# Patient Record
Sex: Male | Born: 1985 | Race: Black or African American | Hispanic: No | Smoking: Never smoker
Health system: Southern US, Community
[De-identification: ages and names within clinical notes are randomized; demographics above are authoritative.]

## PROBLEM LIST (undated history)

## (undated) DIAGNOSIS — J302 Other seasonal allergic rhinitis: Secondary | ICD-10-CM

## (undated) DIAGNOSIS — F319 Bipolar disorder, unspecified: Secondary | ICD-10-CM

## (undated) DIAGNOSIS — R002 Palpitations: Secondary | ICD-10-CM

## (undated) DIAGNOSIS — F1410 Cocaine abuse, uncomplicated: Secondary | ICD-10-CM

## (undated) DIAGNOSIS — T7840XA Allergy, unspecified, initial encounter: Secondary | ICD-10-CM

## (undated) DIAGNOSIS — F32A Depression, unspecified: Secondary | ICD-10-CM

## (undated) DIAGNOSIS — J45909 Unspecified asthma, uncomplicated: Secondary | ICD-10-CM

## (undated) DIAGNOSIS — F329 Major depressive disorder, single episode, unspecified: Secondary | ICD-10-CM

## (undated) DIAGNOSIS — I10 Essential (primary) hypertension: Secondary | ICD-10-CM

## (undated) DIAGNOSIS — D696 Thrombocytopenia, unspecified: Secondary | ICD-10-CM

## (undated) DIAGNOSIS — Z973 Presence of spectacles and contact lenses: Secondary | ICD-10-CM

## (undated) DIAGNOSIS — F419 Anxiety disorder, unspecified: Secondary | ICD-10-CM

## (undated) DIAGNOSIS — K219 Gastro-esophageal reflux disease without esophagitis: Secondary | ICD-10-CM

## (undated) DIAGNOSIS — F99 Mental disorder, not otherwise specified: Secondary | ICD-10-CM

## (undated) HISTORY — DX: Allergy, unspecified, initial encounter: T78.40XA

## (undated) HISTORY — DX: Gastro-esophageal reflux disease without esophagitis: K21.9

## (undated) HISTORY — DX: Depression, unspecified: F32.A

## (undated) HISTORY — DX: Essential (primary) hypertension: I10

## (undated) HISTORY — DX: Other seasonal allergic rhinitis: J30.2

## (undated) HISTORY — DX: Thrombocytopenia, unspecified: D69.6

## (undated) HISTORY — PX: KNEE SURGERY: SHX244

## (undated) HISTORY — PX: MASTOIDECTOMY: SHX711

## (undated) HISTORY — DX: Presence of spectacles and contact lenses: Z97.3

## (undated) HISTORY — DX: Palpitations: R00.2

## (undated) HISTORY — DX: Bipolar disorder, unspecified: F31.9

## (undated) HISTORY — DX: Anxiety disorder, unspecified: F41.9

## (undated) HISTORY — DX: Unspecified asthma, uncomplicated: J45.909

## (undated) HISTORY — DX: Major depressive disorder, single episode, unspecified: F32.9

---
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
## 2009-11-22 ENCOUNTER — Emergency Department (HOSPITAL_COMMUNITY): Admission: EM | Admit: 2009-11-22 | Discharge: 2009-11-23 | Payer: Self-pay | Admitting: Emergency Medicine

## 2010-09-23 LAB — BASIC METABOLIC PANEL
BUN: 17 mg/dL (ref 6–23)
Calcium: 9.9 mg/dL (ref 8.4–10.5)
Chloride: 102 mEq/L (ref 96–112)
Creatinine, Ser: 1.44 mg/dL (ref 0.4–1.5)
GFR calc Af Amer: >60 mL/min (ref >60)
GFR calc non Af Amer: >60 mL/min (ref >60)

## 2010-09-23 LAB — ETHANOL: Alcohol, Ethyl (B): <5 mg/dL (ref 0–10)

## 2010-09-23 LAB — CBC
MCV: 85.4 fL (ref 78.0–100.0)
Platelets: 143 10*3/uL — ABNORMAL LOW (ref 150–400)
RBC: 5.77 MIL/uL (ref 4.22–5.81)
WBC: 11.2 10*3/uL — ABNORMAL HIGH (ref 4.0–10.5)

## 2010-09-23 LAB — DIFFERENTIAL
Basophils Relative: 0 % (ref 0–1)
Eosinophils Absolute: 0 10*3/uL (ref 0.0–0.7)
Lymphs Abs: 1.6 10*3/uL (ref 0.7–4.0)
Monocytes Relative: 7 % (ref 3–12)
Neutro Abs: 8.8 10*3/uL — ABNORMAL HIGH (ref 1.7–7.7)
Neutrophils Relative %: 78 % — ABNORMAL HIGH (ref 43–77)

## 2010-09-23 LAB — RAPID URINE DRUG SCREEN, HOSP PERFORMED
Amphetamines: NOT DETECTED
Barbiturates: NOT DETECTED
Cocaine: POSITIVE — AB
Opiates: NOT DETECTED

## 2011-07-06 ENCOUNTER — Emergency Department (HOSPITAL_COMMUNITY): Payer: Self-pay

## 2011-07-06 ENCOUNTER — Other Ambulatory Visit: Payer: Self-pay

## 2011-07-06 ENCOUNTER — Emergency Department (HOSPITAL_COMMUNITY)
Admission: EM | Admit: 2011-07-06 | Discharge: 2011-07-06 | Disposition: A | Discharge status: Home / Self Care | Payer: Self-pay | Attending: Emergency Medicine | Admitting: Emergency Medicine

## 2011-07-06 DIAGNOSIS — F419 Anxiety disorder, unspecified: Secondary | ICD-10-CM

## 2011-07-06 DIAGNOSIS — R209 Unspecified disturbances of skin sensation: Secondary | ICD-10-CM | POA: Insufficient documentation

## 2011-07-06 DIAGNOSIS — R0789 Other chest pain: Secondary | ICD-10-CM | POA: Insufficient documentation

## 2011-07-06 DIAGNOSIS — F411 Generalized anxiety disorder: Secondary | ICD-10-CM | POA: Insufficient documentation

## 2011-07-06 DIAGNOSIS — R0602 Shortness of breath: Secondary | ICD-10-CM | POA: Insufficient documentation

## 2011-07-06 DIAGNOSIS — N289 Disorder of kidney and ureter, unspecified: Secondary | ICD-10-CM

## 2011-07-06 HISTORY — DX: Cocaine abuse, uncomplicated: F14.10

## 2011-07-06 HISTORY — DX: Mental disorder, not otherwise specified: F99

## 2011-07-06 LAB — BASIC METABOLIC PANEL
BUN: 17 mg/dL (ref 6–23)
Calcium: 9.9 mg/dL (ref 8.4–10.5)
GFR calc Af Amer: 82 mL/min — ABNORMAL LOW (ref >90)
GFR calc non Af Amer: 71 mL/min — ABNORMAL LOW (ref >90)
Glucose, Bld: 92 mg/dL (ref 70–99)

## 2011-07-06 LAB — CBC
HCT: 44.4 % (ref 39.0–52.0)
Hemoglobin: 15.4 g/dL (ref 13.0–17.0)
MCH: 27 pg (ref 26.0–34.0)
MCHC: 34.7 g/dL (ref 30.0–36.0)
MCV: 77.8 fL — ABNORMAL LOW (ref 78.0–100.0)
Platelets: 162 K/uL (ref 150–400)
RBC: 5.71 MIL/uL (ref 4.22–5.81)
RDW: 13.2 % (ref 11.5–15.5)
WBC: 9.6 K/uL (ref 4.0–10.5)

## 2011-07-06 LAB — RAPID URINE DRUG SCREEN, HOSP PERFORMED
Amphetamines: NOT DETECTED
Barbiturates: NOT DETECTED
Benzodiazepines: NOT DETECTED
Cocaine: NOT DETECTED
Opiates: NOT DETECTED
Tetrahydrocannabinol: NOT DETECTED

## 2011-07-06 LAB — BASIC METABOLIC PANEL WITH GFR
CO2: 21 meq/L (ref 19–32)
Chloride: 101 meq/L (ref 96–112)
Creatinine, Ser: 1.36 mg/dL — ABNORMAL HIGH (ref 0.50–1.35)
Potassium: 3.8 meq/L (ref 3.5–5.1)
Sodium: 138 meq/L (ref 135–145)

## 2011-07-06 LAB — POCT I-STAT TROPONIN I: Troponin i, poc: 0 ng/mL (ref 0.00–0.08)

## 2011-07-06 MED ORDER — LORAZEPAM 2 MG/ML IJ SOLN
0.5000 mg | Freq: Once | INTRAMUSCULAR | Status: AC
  Administered 2011-07-06: 0.5 mg via INTRAVENOUS
  Filled 2011-07-06: qty 1

## 2011-07-06 MED ORDER — SODIUM CHLORIDE 0.9 % IV BOLUS (SEPSIS)
1000.0000 mL | Freq: Once | INTRAVENOUS | Status: AC
  Administered 2011-07-06: 1000 mL via INTRAVENOUS

## 2011-07-06 NOTE — ED Notes (Signed)
Pt also sts the chest pain has been there for a while. Pt kept repeating not not wanting to psy floor. Verbally un-polite in room, cussing at his situation, and sts not wanting any psychiatric evaluation because he doesn't want to go to "psy ward" chest pain worsen today. Non-tender upon palpation. Breath sound clear.

## 2011-07-06 NOTE — ED Notes (Signed)
In from home with c/o chest pain x1 week states worse today states feels as if something is sitting on chest states pain radiating to the left arm with sob and nausea

## 2011-07-06 NOTE — Discharge Instructions (Signed)
 Anxiety and Panic Attacks Your caregiver has informed you that you are having an anxiety or panic attack. There may be many forms of this. Most of the time these attacks come suddenly and without warning. They come at any time of day, including periods of sleep, and at any time of life. They may be strong and unexplained. Although panic attacks are very scary, they are physically harmless. Sometimes the cause of your anxiety is not known. Anxiety is a protective mechanism of the body in its fight or flight mechanism. Most of these perceived danger situations are actually nonphysical situations (such as anxiety over losing a job). CAUSES  The causes of an anxiety or panic attack are many. Panic attacks may occur in otherwise healthy people given a certain set of circumstances. There may be a genetic cause for panic attacks. Some medications may also have anxiety as a side effect. SYMPTOMS  Some of the most common feelings are:  Intense terror.   Dizziness, feeling faint.   Hot and cold flashes.   Fear of going crazy.   Feelings that nothing is real.   Sweating.   Shaking.   Chest pain or a fast heartbeat (palpitations).   Smothering, choking sensations.   Feelings of impending doom and that death is near.   Tingling of extremities, this may be from over-breathing.   Altered reality (derealization).   Being detached from yourself (depersonalization).  Several symptoms can be present to make up anxiety or panic attacks. DIAGNOSIS  The evaluation by your caregiver will depend on the type of symptoms you are experiencing. The diagnosis of anxiety or panic attack is made when no physical illness can be determined to be a cause of the symptoms. TREATMENT  Treatment to prevent anxiety and panic attacks may include:  Avoidance of circumstances that cause anxiety.   Reassurance and relaxation.   Regular exercise.   Relaxation therapies, such as yoga.   Psychotherapy with a  psychiatrist or therapist.   Avoidance of caffeine, alcohol and illegal drugs.   Prescribed medication.  SEEK IMMEDIATE MEDICAL CARE IF:   You experience panic attack symptoms that are different than your usual symptoms.   You have any worsening or concerning symptoms.  Document Released: 06/23/2005 Document Revised: 03/05/2011 Document Reviewed: 10/25/2009 [facility] Patient Information 2012 [place], [place].     Your ECG, lab tests, chest xray were all ok.  You do not have a primary heart problem.   You should discuss your symptoms with your primary care physician as you may need further monitoring of your heart rate, recheck of your kidney function and evaluation for stress levels and possibly hormone levels in the future.

## 2011-07-06 NOTE — ED Notes (Signed)
Patient is alert and oriented x3.  He is complaining of chest pain rated 10 of 10 that started today.   He states that he is also having numbness in the left hand.  He states that he is light headed and some  Dizziness.  Patient denies any history of cardiac issues.

## 2011-07-06 NOTE — ED Provider Notes (Cosign Needed)
History     CSN: [id number]  Arrival date & time 07/06/11  1455   First MD Initiated Contact with Patient 07/06/11 1545      Chief Complaint  Patient presents with  . Chest Pain    (Consider location/radiation/quality/duration/timing/severity/associated sxs/prior treatment) HPI Comments: Patient presents due to episodes of sharp chest pain associated with chest pressure along with palpitations. He feels short of breath. He endorses feeling anxious and appears quite anxious. He denies any recent fevers, cold symptoms, coughing. Patient reports these episodes have been getting worse over the last 2-3 weeks. Today he was at church and had several episodes and he reports that he was no longer able to tolerate his symptoms and this came to the emergency department. He does have a primary care physician but has not told his doctor about these symptoms. He reports he had a brief episode of this earlier this summer. He endorses increased stress levels to 2 new job and issues with his spouse and family. His prior history includes history of drug abuse including cocaine and crack which he reports he stopped a year and a half ago. He reports he also used to be on medications for psychosis which he has weaned himself off of. He will not elucidate to me why he was on those medications nor more about his psychiatric history. He does state that he had overdosed in the past and was hospitalized at [facility] approximately 2 years ago. 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 He reports chest pain is not necessarily pleuritic. He reports that since she's been here in emergency department his symptoms have gradually been improving.  He also endorses tinglign in his right hand primarily.    Patient is a 25 y.o. male presenting with chest pain. The history is provided by the patient.  Chest Pain Primary symptoms include shortness of breath and palpitations. Pertinent  negatives for primary symptoms include no fever and no cough.  The palpitations also occurred with shortness of breath.  Associated symptoms include numbness.     No past medical history on file.  No past surgical history on file.  No family history on file.  History  Substance Use Topics  . Smoking status: Not on file  . Smokeless tobacco: Not on file  . Alcohol Use: Not on file      Review of Systems  Constitutional: Negative for fever and chills.  HENT: Negative for nosebleeds and congestion.   Respiratory: Positive for chest tightness and shortness of breath. Negative for cough.   Cardiovascular: Positive for chest pain and palpitations. Negative for leg swelling.  Musculoskeletal: Negative for back pain.  Neurological: Positive for numbness. Negative for headaches.  Psychiatric/Behavioral: The patient is nervous/anxious.   All other systems reviewed and are negative.    Allergies  Haldol; Nsaids; and Penicillins  Home Medications  No current outpatient prescriptions on file.  BP 139/75  Pulse 98  Temp(Src) 98.6 F (37 C) (Oral)  Resp 18  SpO2 100%  Physical Exam  Nursing note and vitals reviewed. Constitutional: He appears well-developed and well-nourished.  HENT:  Head: Normocephalic and atraumatic.  Eyes: Pupils are equal, round, and reactive to light.  Cardiovascular: Normal rate, regular rhythm, normal heart sounds and intact distal pulses.  PMI is not displaced.   No murmur heard. Pulmonary/Chest: Breath sounds normal. Tachypnea noted.  Abdominal: Soft.  Neurological: He is alert.  Skin: Skin is warm and dry.  Psychiatric: His mood appears anxious. He is not slowed and not withdrawn. Cognition and memory are not impaired.    ED Course  Procedures (including critical care time)  Labs Reviewed  CBC - Abnormal; Notable for the following:    MCV 77.8 (*)    All other components within normal limits  BASIC METABOLIC PANEL - Abnormal; Notable  for the following:    Creatinine, Ser 1.36 (*)    GFR calc non Af Amer 71 (*)    GFR calc Af Amer 82 (*)    All other components within normal limits  URINE RAPID DRUG SCREEN (HOSP PERFORMED)  POCT I-STAT TROPONIN I  I-STAT TROPONIN I   Dg Chest Portable 1 View  07/06/2011  *RADIOLOGY REPORT*  Clinical Data: Chest pain.  Shortness of breath.  PORTABLE CHEST - 1 VIEW 07/06/2011 1805 hours:  Comparison: None.  Findings: Cardiac silhouette normal and mediastinal contours unremarkable for the AP portable technique.  Lungs clear. Pulmonary vascularity normal.  Bronchovascular markings normal.  No pneumothorax.  No pleural effusions.  IMPRESSION: No acute cardiopulmonary disease.  Original Report Authenticated By: Arnell Sieving, M.D.     1. Anxiety   2. Renal insufficiency      ECG at time 15:01 shows sinus tachy at rate 127, artifact present, RSR' in V1 and V2 which is probably normal.  Early repol. MDM  Pt is clearly anxious.  I suspect given his age and lack of risk factors, this is not primary cardiac.  Pt reports since weaning himself off of his psych medications, he has lost about 45 lbs in the past year and a half, was up to 194 in July of 2011 and now he weighs about 150.  He is anxious, but I do not sense any emergent psych issues, will give some IVF's, IV ativan, reassurance.  Will get routine labs, ECG here.  I do not see any signs or symptoms concerning for DVT.       6:30 PM Repeat ECG at 18:27 shows NSR at rate 89, RSR` seen, early repol noted.  Pt feels improved, wants to eat dinner.  Labs, CXR are all ok.     7:08 PM Pt reports feeling much improved.  He feels comfortable going home.  He acknowledges slightly bumped Cr which is improved from last BMP, and to follow up with PCP regarding this as well as tachycardia and stress.    Gavin Pound. Oletta Lamas, MD 07/06/11 3086

## 2012-12-26 IMAGING — CR DG CHEST 1V PORT
1 series · 2 of 2 positions shown · non-contrast
Comparison: None.

CLINICAL DATA: Chest pain.  Shortness of breath.

PORTABLE CHEST - 1 VIEW [DATE]/[id number] hours:

[Series 1: AP · U · 2 of 2 slices shown]
[im 1/2]
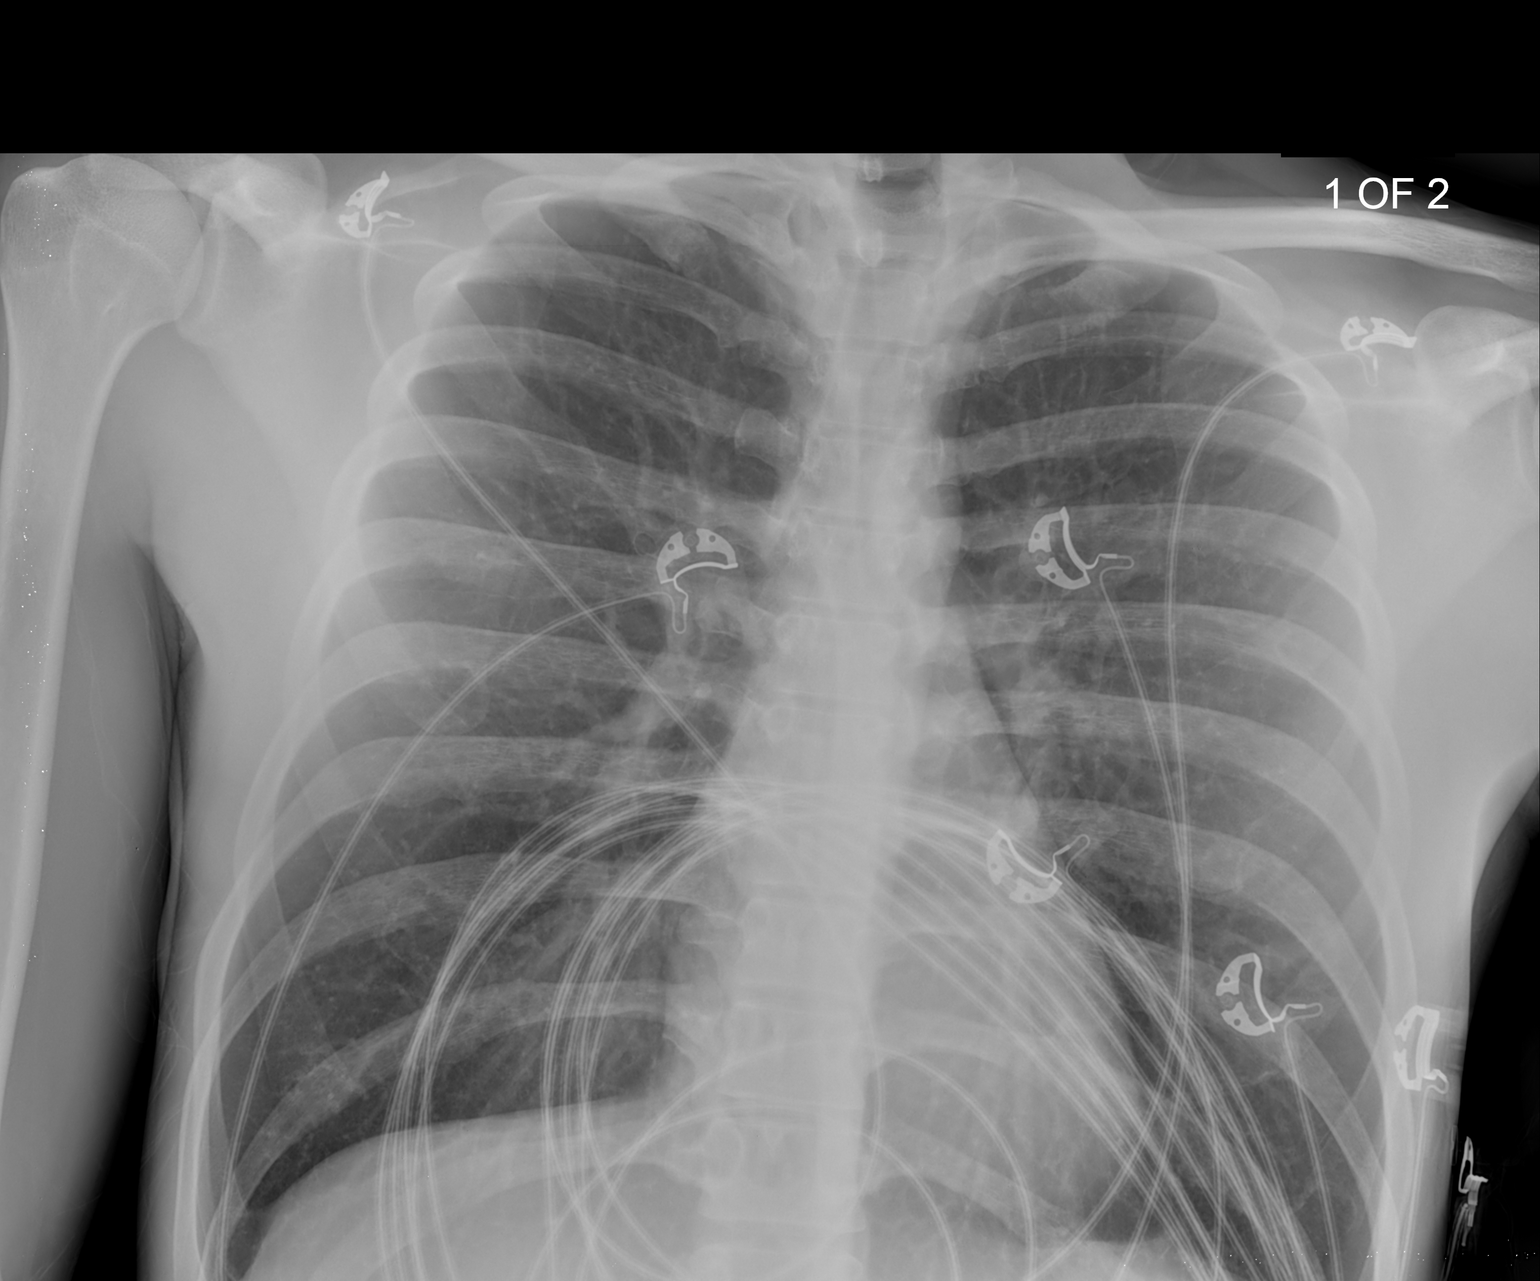
[im 2/2]
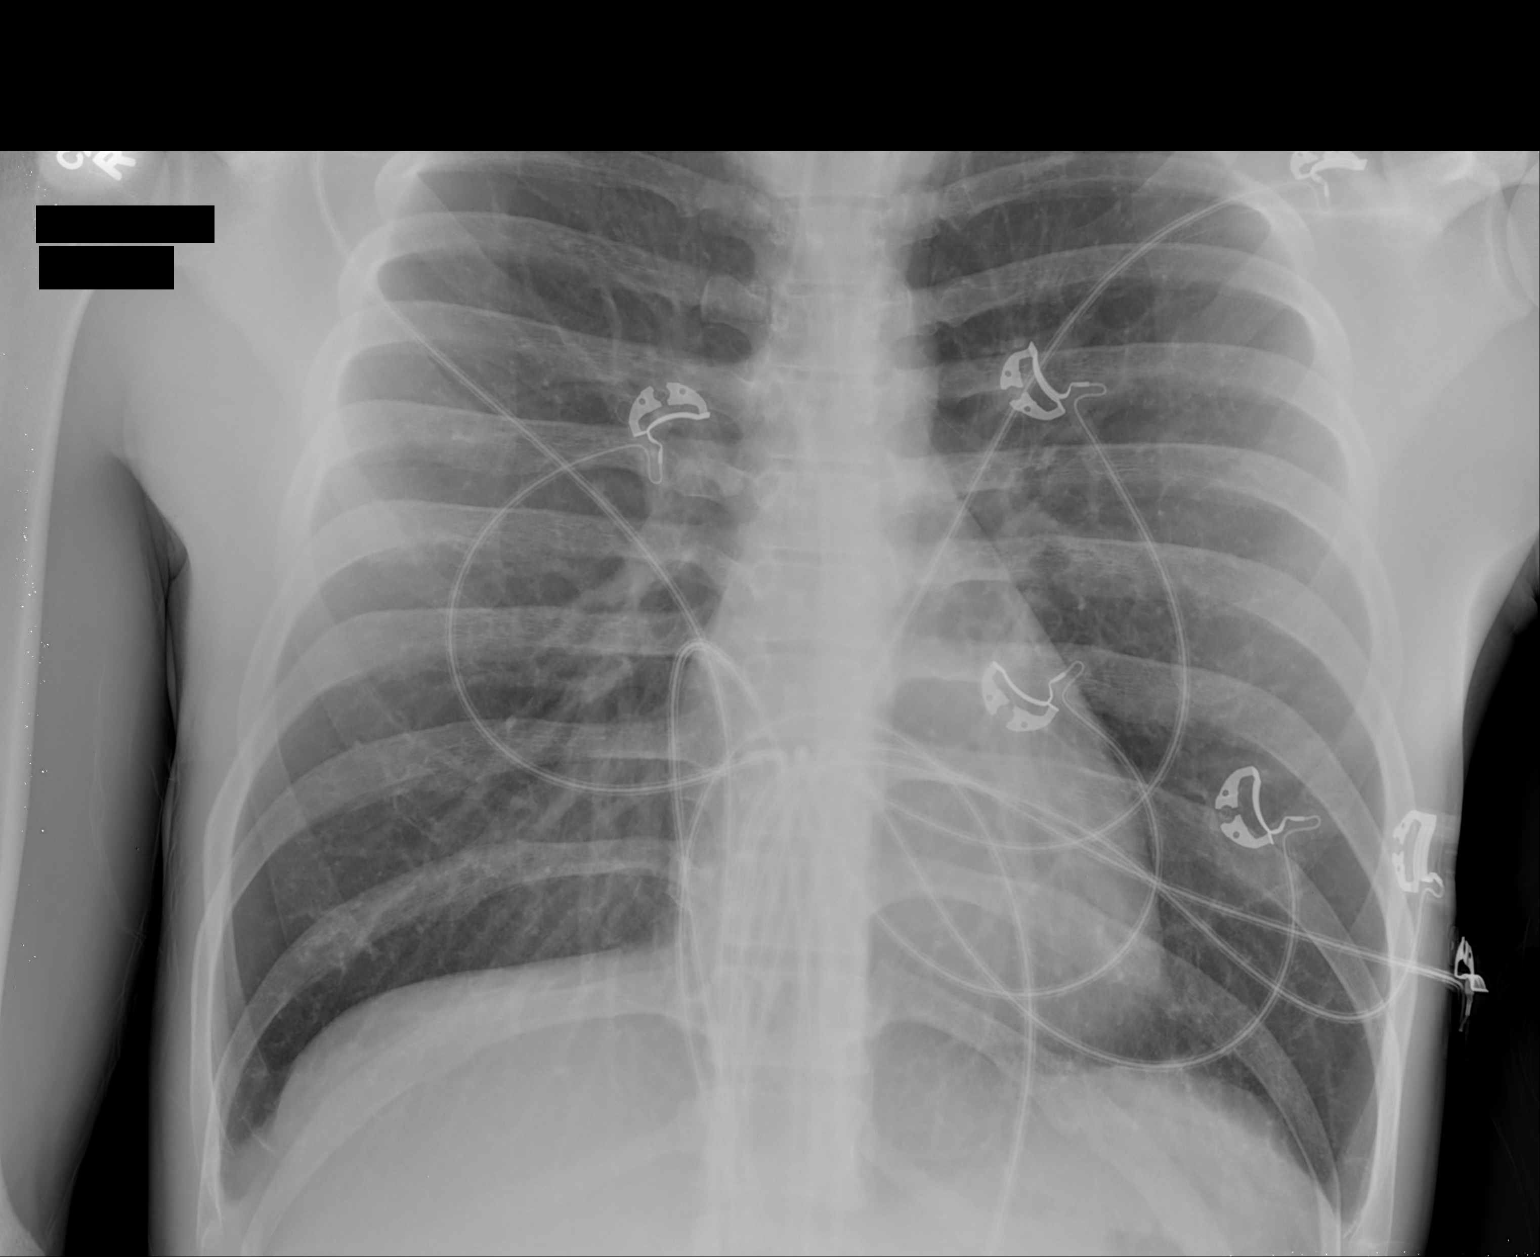

[2 of 2 positions shown; findings below may reference images not displayed]

FINDINGS: Cardiac silhouette normal and mediastinal contours
unremarkable for the AP portable technique.  Lungs clear.
Pulmonary vascularity normal.  Bronchovascular markings normal.  No
pneumothorax.  No pleural effusions.
IMPRESSION: No acute cardiopulmonary disease.

## 2013-04-09 ENCOUNTER — Ambulatory Visit (INDEPENDENT_AMBULATORY_CARE_PROVIDER_SITE_OTHER): Payer: PRIVATE HEALTH INSURANCE | Admitting: Family Medicine

## 2013-04-09 VITALS — BP 118/72 | HR 70 | Temp 98.4°F | Resp 16 | Ht 69.25 in | Wt 175.4 lb

## 2013-04-09 DIAGNOSIS — B9789 Other viral agents as the cause of diseases classified elsewhere: Secondary | ICD-10-CM

## 2013-04-09 DIAGNOSIS — B349 Viral infection, unspecified: Secondary | ICD-10-CM

## 2013-04-09 LAB — POCT CBC
Granulocyte percent: 69.1 %G (ref 37–80)
Lymph, poc: 1.4 (ref 0.6–3.4)
MPV: 11.4 fL (ref 0–99.8)
POC LYMPH PERCENT: 21.6 %L (ref 10–50)
Platelet Count, POC: 126 10*3/uL — AB (ref 142–424)
RBC: 5.56 M/uL (ref 4.69–6.13)
RDW, POC: 14.1 %
WBC: 6.6 10*3/uL (ref 4.6–10.2)

## 2013-04-09 LAB — POCT INFLUENZA A/B: Influenza B, POC: NEGATIVE

## 2013-04-09 MED ORDER — BENZONATATE 100 MG PO CAPS: 100.0000 mg | ORAL_CAPSULE | Freq: Three times a day (TID) | ORAL | Status: DC | PRN

## 2013-04-09 MED ORDER — ONDANSETRON 4 MG PO TBDP: ORAL_TABLET | ORAL | Status: DC

## 2013-04-09 MED ORDER — PROMETHAZINE HCL 25 MG PO TABS: ORAL_TABLET | ORAL | Status: DC

## 2013-04-09 NOTE — Addendum Note (Signed)
Addended by: Eddie Candle on: 04/09/2013 04:55 PM   Modules accepted: Orders

## 2013-04-09 NOTE — Patient Instructions (Addendum)
Try to stay well hydrated  Take the nausea pills when necessary for nausea or vomiting. Use about every 4-6 hours only when needed.  Take the cough pills every 6 to 8 hours if needed for cough  If not better stay off of work Monday. If feeling better return to duty

## 2013-04-09 NOTE — Progress Notes (Signed)
Subjective: Patient has been sick since Wednesday. 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 The patient has had a headache, coughing, vomiting, she stool. He feels a little bit dizzy. He is often we can. He is fatigued. He has since chest pains in the front of his chest just been feeling achy. His he has a cough with nonproductive. He has no urinary symptoms. His body is achy.  Objective: Looks moderately ill. His TMs are normal. Eyes normal. Throat clear. Neck supple without significant nodes. Chest is clear to auscultation. Heart regular without murmurs. Return bowel sounds with abdomen soft and nontender. Skin unremarkable, warm and dry.  Assessment: Viral syndrome  Plan: CBC Flu test  Results for orders placed in visit on 04/09/13  POCT CBC      Result Value Range   WBC 6.6  4.6 - 10.2 K/uL   Lymph, poc 1.4  0.6 - 3.4   POC LYMPH PERCENT 21.6  10 - 50 %L   MID (cbc) 0.6  0 - 0.9   POC MID % 9.3  0 - 12 %M   POC Granulocyte 4.6  2 - 6.9   Granulocyte percent 69.1  37 - 80 %G   RBC 5.56  4.69 - 6.13 M/uL   Hemoglobin 15.3  14.1 - 18.1 g/dL   HCT, POC 16.1  09.6 - 53.7 %   MCV 87.8  80 - 97 fL   MCH, POC 27.5  27 - 31.2 pg   MCHC 31.4 (*) 31.8 - 35.4 g/dL   RDW, POC 04.5     Platelet Count, POC 126 (*) 142 - 424 K/uL   MPV 11.4  0 - 99.8 fL  POCT INFLUENZA A/B      Result Value Range   Influenza A, POC Negative     Influenza B, POC Negative

## 2013-11-19 ENCOUNTER — Ambulatory Visit: Payer: Self-pay | Admitting: Family Medicine

## 2013-11-19 VITALS — BP 110/68 | HR 87 | Temp 99.0°F | Resp 18 | Ht 69.75 in | Wt 183.0 lb

## 2013-11-19 DIAGNOSIS — B349 Viral infection, unspecified: Secondary | ICD-10-CM

## 2013-11-19 DIAGNOSIS — J209 Acute bronchitis, unspecified: Secondary | ICD-10-CM

## 2013-11-19 MED ORDER — AZITHROMYCIN 250 MG PO TABS: ORAL_TABLET | ORAL | Status: DC

## 2013-11-19 MED ORDER — BENZONATATE 200 MG PO CAPS: 200.0000 mg | ORAL_CAPSULE | Freq: Three times a day (TID) | ORAL | Status: DC | PRN

## 2013-11-19 MED ORDER — CETIRIZINE HCL 10 MG PO TABS: 10.0000 mg | ORAL_TABLET | Freq: Every day | ORAL | Status: DC

## 2013-11-19 MED ORDER — PSEUDOEPHEDRINE HCL ER 120 MG PO TB12: 120.0000 mg | ORAL_TABLET | Freq: Two times a day (BID) | ORAL | Status: DC

## 2013-11-19 MED ORDER — GUAIFENESIN-CODEINE 100-10 MG/5ML PO SOLN: 5.0000 mL | ORAL | Status: DC | PRN

## 2013-11-19 NOTE — Progress Notes (Signed)
Subjective:    Patient ID: Kenneth Graham, male    DOB: 1985-11-05, 28 y.o.   MRN: [id number] Chief Complaint  Patient presents with  . Bronchitis    cough, sinus pain and pressure, chest heaviness x 1 month     HPI This chart was scribed for Kenneth MochaEva N Shaw, MD by Charline BillsEssence Howell, ED Scribe. The patient was seen in room 4. Patient's care was started at 10:56 AM.  HPI Comments: Kenneth Graham is a 28 y.o. male who presents to the [facility] complaining of productive cough onset 3 weeks ago. Pt describes his mucous as yellow. He reports associated nasal congestion, sinus pressure, pressure in bilateral ears, allergies, sore throat that resolved last night. He denies fever, chills, sweats, wheezing and SOB. Pt has tried Zyrtec, Flonase, Mucinex and Theraflu with mild relief. He has also used Advair 2 weeks ago that he feels worsened his symptoms. Pt has a h/o asthma, specifically as a child. He reports his 5414 m.o. son was diagnosed with bronchitis last Sunday. Pt has not used Sudafed.  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   Past Medical History  Diagnosis Date  . Cocaine abuse   . Psychiatric diagnosis   . Allergy   . Asthma   . Depression   . Seasonal allergies    Current Outpatient Prescriptions on File Prior to Visit  Medication Sig Dispense Refill  . cetirizine (ZYRTEC) 10 MG tablet Take 10 mg by mouth daily.      . benzonatate (TESSALON) 100 MG capsule Take 1-2 capsules (100-200 mg total) by mouth 3 (three) times daily as needed for cough.  30 capsule  0  . ondansetron (ZOFRAN-ODT) 4 MG disintegrating tablet Take one every 4-6 hours when needed for nausea or vomiting  20 tablet  0  . promethazine (PHENERGAN) 25 MG tablet Take 1 tablet every 6 hours as needed for nausea. Do not take this medication while driving.  20 tablet  0   No current facility-administered medications on file prior to visit.    Allergies  Allergen Reactions  . Haldol [Haloperidol Decanoate] Anaphylaxis  . Nsaids Other (See Comments)    unknown  . Penicillins Other (See Comments)    unknown  . Rocephin [Ceftriaxone] Hives   Review of Systems  Constitutional: Negative for fever, chills and diaphoresis.  HENT: Positive for congestion, sinus pressure and sore throat (resolved).   Respiratory: Positive for cough. Negative for shortness of breath and wheezing.   Allergic/Immunologic: Positive for environmental allergies.       Objective:  BP 110/68  Pulse 87  Temp(Src) 99 F (37.2 C) (Oral)  Resp 18  Ht 5' 9.75" (1.772 m)  Wt 183 lb (83.008 kg)  BMI 26.44 kg/m2  SpO2 100%  Physical Exam  Nursing note and vitals reviewed. Constitutional: He appears well-developed and well-nourished. No distress.  HENT:  Head: Normocephalic and atraumatic.  Right Ear: Tympanic membrane normal.  Mouth/Throat: Oropharyngeal exudate (L tonsil), posterior oropharyngeal edema and posterior oropharyngeal erythema (mild) present.  L ear:  Cerumen  Neck: Neck supple.  Cardiovascular: Normal rate, regular rhythm, S1 normal and S2 normal.   Pulmonary/Chest: Effort normal and breath sounds normal.  Lungs clear  Lymphadenopathy:    He has cervical adenopathy.       Right cervical: Superficial cervical adenopathy present.       Left cervical: Superficial cervical adenopathy present.  Neurological: He  is alert.  Skin: He is not diaphoretic.      Assessment & Plan:   Acute bronchitis  Viral syndrome - Plan: benzonatate (TESSALON) 200 MG capsule  Meds ordered this encounter  Medications  . cetirizine (ZYRTEC) 10 MG tablet    Sig: Take 1 tablet (10 mg total) by mouth daily.    Dispense:  30 tablet    Refill:  2  . azithromycin (ZITHROMAX) 250 MG tablet    Sig: Take 2 tabs PO x 1 dose, then 1 tab PO QD x 4 days    Dispense:  6 tablet    Refill:  0  . benzonatate (TESSALON) 200 MG capsule    Sig: Take 1 capsule  (200 mg total) by mouth 3 (three) times daily as needed for cough.    Dispense:  30 capsule    Refill:  0  . guaiFENesin-codeine 100-10 MG/5ML syrup    Sig: Take 5 mLs by mouth every 4 (four) hours as needed for cough.    Dispense:  120 mL    Refill:  0  . pseudoephedrine (SUDAFED 12 HOUR) 120 MG 12 hr tablet    Sig: Take 1 tablet (120 mg total) by mouth 2 (two) times daily.    Dispense:  14 tablet    Refill:  0    I personally performed the services described in this documentation, which was scribed in my presence. The recorded information has been reviewed and considered, and addended by me as needed.  Norberto SorensonEva Shaw, MD MPH

## 2015-11-01 ENCOUNTER — Encounter: Payer: Self-pay | Admitting: Medical

## 2015-11-01 ENCOUNTER — Ambulatory Visit (INDEPENDENT_AMBULATORY_CARE_PROVIDER_SITE_OTHER): Payer: BLUE CROSS/BLUE SHIELD | Admitting: Medical

## 2015-11-01 VITALS — BP 128/90 | HR 80 | Wt 187.0 lb

## 2015-11-01 DIAGNOSIS — Z840 Family history of diseases of the skin and subcutaneous tissue: Secondary | ICD-10-CM

## 2015-11-01 DIAGNOSIS — D696 Thrombocytopenia, unspecified: Secondary | ICD-10-CM

## 2015-11-01 DIAGNOSIS — R748 Abnormal levels of other serum enzymes: Secondary | ICD-10-CM | POA: Diagnosis not present

## 2015-11-01 DIAGNOSIS — Z828 Family history of other disabilities and chronic diseases leading to disablement, not elsewhere classified: Secondary | ICD-10-CM

## 2015-11-01 LAB — COMPREHENSIVE METABOLIC PANEL
ALK PHOS: 57 U/L (ref 40–115)
ALT: 27 U/L (ref 9–46)
AST: 28 U/L (ref 10–40)
Albumin: 4.5 g/dL (ref 3.6–5.1)
BILIRUBIN TOTAL: 0.6 mg/dL (ref 0.2–1.2)
BUN: 14 mg/dL (ref 7–25)
CO2: 23 mmol/L (ref 20–31)
CREATININE: 1.24 mg/dL (ref 0.60–1.35)
Calcium: 9.7 mg/dL (ref 8.6–10.3)
Chloride: 99 mmol/L (ref 98–110)
GLUCOSE: 71 mg/dL (ref 65–99)
Potassium: 4.5 mmol/L (ref 3.5–5.3)
SODIUM: 135 mmol/L (ref 135–146)
Total Protein: 7.3 g/dL (ref 6.1–8.1)

## 2015-11-01 LAB — CBC WITH DIFFERENTIAL/PLATELET
BASOS ABS: 0 {cells}/uL (ref 0–200)
Basophils Relative: 0 %
EOS ABS: 90 {cells}/uL (ref 15–500)
EOS PCT: 1 %
HCT: 46.5 % (ref 38.5–50.0)
Hemoglobin: 15.3 g/dL (ref 13.2–17.1)
LYMPHS PCT: 22 %
Lymphs Abs: 1980 cells/uL (ref 850–3900)
MCH: 27 pg (ref 27.0–33.0)
MCHC: 32.9 g/dL (ref 32.0–36.0)
MCV: 82 fL (ref 80.0–100.0)
MONOS PCT: 7 %
Monocytes Absolute: 630 cells/uL (ref 200–950)
NEUTROS PCT: 70 %
Neutro Abs: 6300 cells/uL (ref 1500–7800)
PLATELETS: 187 10*3/uL (ref 140–400)
RBC: 5.67 MIL/uL (ref 4.20–5.80)
RDW: 14.3 % (ref 11.0–15.0)
WBC: 9 10*3/uL (ref 4.0–10.5)

## 2015-11-01 LAB — TSH: TSH: 1.02 mIU/L (ref 0.40–4.50)

## 2015-11-01 NOTE — Progress Notes (Signed)
Subjective: Chief Complaint  Patient presents with  . New Patient (Initial Visit)    get established. has had high CK levels and pt said his prior doctor was keeping an eye on it. he started working out and they went back up. sees rhu[facility] 6/[facility], [facility].    Here as a new patient.   Was seeing [facility] prior but this office is very close to his house.  Here to establish care.  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  Went to have physical in December, CK levels were back to normal, however, February had high CK levels again.  [facility].   Wants to see if we can get him in sooner.   Feels fine in general, but still trying to find out why his CK is elevated.   Chart history shows hx/o substance abuse, but he denies cocaine or other substance abuse in the last few years, been clean.  No other concerns.  Past Medical History  Diagnosis Date  . Cocaine abuse   . Psychiatric diagnosis     Dr. Karmen BongoBrian Fehrer, hospitalization [facility] in the past  . Allergy   . Asthma   . Depression   . Seasonal allergies   . Wears glasses   . GERD (gastroesophageal reflux disease)   . Bipolar disorder (HCC)   . Anxiety   . Thrombocytopenia (HCC)    ROS as in subjective    Objective: BP 128/90 mmHg  Pulse 80  Wt 187 lb (84.823 kg)  General appearance: alert, no distress, WD/WN HEENT: normocephalic, sclerae anicteric, TMs pearly, nares patent, no discharge or erythema, pharynx normal Oral cavity: MMM, no lesions Neck: supple, no lymphadenopathy, no thyromegaly, no masses Heart: RRR, normal S1, S2, no murmurs Lungs: CTA bilaterally, no wheezes, rhonchi, or rales Abdomen: +bs, soft, non tender, non distended, no masses, no hepatomegaly, no splenomegaly Pulses: 2+ symmetric, upper and lower extremities, normal cap refill Ext: no edema MSK: non tender    Assessment: Encounter  Diagnoses  Name Primary?  . Elevated CK Yes  . Thrombocytopenia (HCC)   . Family history of lupus erythematosus      Plan: Reviewed the prior limited records that we received, labs today, and will try and [place].  Currently asymptomatic but recurrent CK elevation etiology unclear, possible underlying rheumatologic disease.  He has at least 2 family members, non first degree with autoimmune disease.  F/u pending labs.   Molly MaduroRobert was seen today for new patient (initial visit).  Diagnoses and all orders for this visit:  Elevated CK -     Comprehensive metabolic panel -     CBC with Differential/Platelet -     TSH -     CK isoenzymes (brain, muscle injury) -     ANA  Thrombocytopenia (HCC) -     Comprehensive metabolic panel -     CBC with Differential/Platelet -     TSH -     CK isoenzymes (brain, muscle injury) -     ANA  Family history of lupus erythematosus

## 2015-11-02 LAB — ANA: ANA: NEGATIVE

## 2015-11-05 ENCOUNTER — Telehealth: Payer: Self-pay | Admitting: Medical

## 2015-11-05 NOTE — Telephone Encounter (Signed)
I messed up scheduling it and didn't realize it was not a Friday but pt told me that he would call and reschedule that it was no problem. Stated that he wouldn't get an app[facility] until August so he is just sticking with the one he has in June.

## 2015-11-05 NOTE — Telephone Encounter (Signed)
Dr Kellie Simmeringruslow Office called and states that the pt called and canceled his appt  For wed May 3rd because he cannot come in on Friday because of work,  He states that he will try and fine someone that can see him on a Friday, he can not change his work schedule, she tried to accommodate other times but he was not willing to work with her on them.

## 2015-11-05 NOTE — Telephone Encounter (Signed)
I specifically said it had to be Friday appt only, so not sure if we didn't request or if there was some other issue.

## 2015-11-06 LAB — CK ISOENZYMES
CK MB: 0 % (ref <5)
CK-BB: 0 %
CK-MM: 100 % (ref 95–100)
Creatine Kinase, Total: 120 U/L (ref 44–196)

## 2015-11-09 ENCOUNTER — Institutional Professional Consult (permissible substitution): Payer: Self-pay | Admitting: Medical

## 2015-11-09 ENCOUNTER — Telehealth: Payer: Self-pay | Admitting: Medical

## 2015-11-09 NOTE — Telephone Encounter (Signed)
Rcvd Office notes, immunizations from [facility]

## 2015-11-15 ENCOUNTER — Encounter: Payer: Self-pay | Admitting: Medical

## 2015-11-20 ENCOUNTER — Encounter: Payer: Self-pay | Admitting: Medical

## 2015-12-07 ENCOUNTER — Ambulatory Visit (INDEPENDENT_AMBULATORY_CARE_PROVIDER_SITE_OTHER): Payer: BLUE CROSS/BLUE SHIELD | Admitting: Medical

## 2015-12-07 ENCOUNTER — Encounter: Payer: Self-pay | Admitting: Medical

## 2015-12-07 VITALS — BP 148/96 | HR 66 | Temp 98.4°F | Wt 180.0 lb

## 2015-12-07 DIAGNOSIS — K5900 Constipation, unspecified: Secondary | ICD-10-CM | POA: Diagnosis not present

## 2015-12-07 DIAGNOSIS — R030 Elevated blood-pressure reading, without diagnosis of hypertension: Secondary | ICD-10-CM

## 2015-12-07 DIAGNOSIS — K219 Gastro-esophageal reflux disease without esophagitis: Secondary | ICD-10-CM

## 2015-12-07 MED ORDER — LINACLOTIDE 145 MCG PO CAPS: 145.0000 ug | ORAL_CAPSULE | Freq: Every day | ORAL | Status: DC

## 2015-12-07 NOTE — Progress Notes (Signed)
Subjective: Chief Complaint  Patient presents with  . Abdominal Pain    acid reflux type symptoms. said that he does not go regularly with bowel movements. mentoioned a GI referral. sinus infection?    Here for stomach been messed up.   Thinks he has IBS.  Hasn't been able to go the bathroom regularly of late.   Has hx/o GERD, has used Prilosec prior.   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   Last year his PCP was going to send him to GI.  He declined at the time due to costs.   Lately not having regular BMs.   Thinks he only has 1 BM in a week of late.   Eats good, not having abdominal pain, but seems blocked up.  No loose stools, no blood in stool, no hemorrhoids.  Been having this problems the last month or more.  Drinks 8-9 bottles of water daily.   No prior medication for constipation.  No family hx/o bowel disease.  Brother has stomach issues, but thinks its ulcers.  Has had constipation at other times in the past too.  Few nights ago had some irritation in his nose.  [place] weather.    Has a little post nasal drainage.  Elevate BPs - no hx/o hypertension . 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  No chest pain, edema, SOB or other concerns.  Past Medical History  Diagnosis Date  . Cocaine abuse   . Psychiatric diagnosis     Dr. Karmen BongoBrian Fehrer, hospitalization [facility] in the past  . Allergy   . Asthma   . Depression   . Seasonal allergies   . Wears glasses   . GERD (gastroesophageal reflux disease)   . Bipolar disorder (HCC)   . Anxiety   . Thrombocytopenia (HCC)    Family History  Problem Relation Age of Onset  . Depression Mother   . Bipolar disorder Mother      ROS as in subjective  Objective: BP 148/96 mmHg  Pulse 66  Temp(Src) 98.4 F (36.9 C) (Tympanic)  Wt 180 lb (81.647 kg)  BP Readings from Last 3 Encounters:  12/07/15 148/96  11/01/15 128/90  11/19/13 110/68   General  appearance: alert, no distress, WD/WN HEENT: normocephalic, sclerae anicteric, TMs pearly, nares patent, no discharge or erythema, pharynx normal Oral cavity: MMM, no lesions Neck: supple, no lymphadenopathy, no thyromegaly, no masses Heart: RRR, normal S1, S2, no murmurs Lungs: CTA bilaterally, no wheezes, rhonchi, or rales Abdomen: +bs, soft, non tender, non distended, no masses, no hepatomegaly, no splenomegaly Pulses: 2+ symmetric, upper and lower extremities, normal cap refill Ext: no edema   Assessment: Encounter Diagnoses  Name Primary?  . Constipation, unspecified constipation type Yes  . Gastroesophageal reflux disease without esophagitis   . Elevated blood-pressure reading without diagnosis of hypertension      Plan: discussed concerns suggestive of functional constipation  Begin trial of Linzess.  discussed risks/and benefits of medication.  Advised GI referral.  He will check insurance coverage first.    GERD - not a concern currently.     Elevated BP - he will monitor some on his own.   Advised he get me readings in a few weeks.   Fu in 2wk on Linzess.

## 2015-12-10 ENCOUNTER — Telehealth: Payer: Self-pay | Admitting: Medical

## 2015-12-10 DIAGNOSIS — K5900 Constipation, unspecified: Secondary | ICD-10-CM

## 2015-12-10 NOTE — Telephone Encounter (Signed)
See message.

## 2015-12-10 NOTE — Telephone Encounter (Addendum)
Pt did check with insurance about coverage for a GI appt and he found out he will have to pay a specialist copay & pt is ok with that so he is ready to proceed with referral to GI now. Pt wants a Friday appt after 10am or 11am

## 2015-12-11 ENCOUNTER — Encounter: Payer: Self-pay | Admitting: Gastroenterology

## 2015-12-11 NOTE — Telephone Encounter (Signed)
i have put referral in to [facility]

## 2015-12-21 ENCOUNTER — Ambulatory Visit: Payer: Self-pay | Admitting: Internal Medicine

## 2015-12-28 DIAGNOSIS — R748 Abnormal levels of other serum enzymes: Secondary | ICD-10-CM | POA: Diagnosis not present

## 2016-01-14 ENCOUNTER — Ambulatory Visit (INDEPENDENT_AMBULATORY_CARE_PROVIDER_SITE_OTHER): Payer: BLUE CROSS/BLUE SHIELD | Admitting: Medical

## 2016-01-14 ENCOUNTER — Encounter: Payer: Self-pay | Admitting: Medical

## 2016-01-14 VITALS — BP 130/82 | HR 95 | Wt 176.0 lb

## 2016-01-14 DIAGNOSIS — F411 Generalized anxiety disorder: Secondary | ICD-10-CM

## 2016-01-14 DIAGNOSIS — F3170 Bipolar disorder, currently in remission, most recent episode unspecified: Secondary | ICD-10-CM

## 2016-01-14 DIAGNOSIS — R748 Abnormal levels of other serum enzymes: Secondary | ICD-10-CM | POA: Diagnosis not present

## 2016-01-14 MED ORDER — ESCITALOPRAM OXALATE 10 MG PO TABS: 10.0000 mg | ORAL_TABLET | Freq: Every day | ORAL | Status: DC

## 2016-01-14 NOTE — Patient Instructions (Signed)
RESOURCES in [place], [place]  If you are experiencing a mental health crisis or an emergency, please call 911 or go to the nearest emergency department.  [facility]   [phone] [facility]  [phone] [facility]   [phone]  Suicide Hotline 1-800-Suicide (1-800-784-2433)  National Suicide Prevention Lifeline 1-800-273-TALK  (1-800-273-8255)  Domestic Violence, Rape/Crisis - [facility] 336-273-7273  The National Domestic Violence Hotline 1-800-799-SAFE (1-800-799-7233)  To report Child or Elder Abuse, please call: [place] [employer] Department  [phone] [place] [employer]  [phone]  LGBT Youth Crisis Line 1-866-488-7386  Teen Crisis line 336-387-6161 or 1-877-332-7333     Psychiatry and Counseling services  [facility] [phone] [facility], [address], [place], [place] [address]   Dr. Parish McKinney, psychiatry [phone]  [email or web address] [address], [address], [place], [place] [address]   [facility] [phone] office [email or web address] [address] [place], [place] [address]   Dr. Rupinder Kaur, psychiatry [phone] [address], [place], [place] [address]   

## 2016-01-14 NOTE — Progress Notes (Signed)
Subjective: Chief Complaint  Patient presents with  . Anxiety    was on lexapro but it raised his CK levels. is wanting to try something else. has tried another medication but cant remember name. did have a substance abuse problem. can not take something that makes him tired.    Here for f/u.    Has hypertension, but today and at his recent visit with rheumatology, BP was good both times.    Elevated CK - Saw rheumatology at [facility].  Was told he can go back to exercise, but they advised he f/u the next time he has the same muscle aches  Here to discuss anxiety.   Was on Lexapro last year, but felt that it may have been the cause of his elevated CK, however, he has hx/o substance abuse.   Wants to go back on something for anxiety, but wants to avoid elevated CK and sedation.  Went through divorce last year, had a lot of stress last year.   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   Went to [facility] practice in the past.    He notes mostly feeling anxious.  Gets his son every weekend, and gets a lot of anxiety dealing with ex-wife, she is combative and hard to deal with.  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   Is under stress.   Can get mood swing from time to time.   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   Last counseling 2012.   Doesn't feel depressed.   Denies SI/HI.    Mother has bipolar and has depression.    Past Medical History  Diagnosis Date  . Cocaine abuse   . Psychiatric diagnosis     Dr. Karmen Bongo, hospitalization [facility] in the past  . Allergy   . Asthma   . Depression   . Seasonal allergies   . Wears glasses   . GERD (gastroesophageal reflux disease)   . Bipolar disorder (HCC)   . Anxiety   . Thrombocytopenia (HCC)    Current Outpatient Prescriptions on File Prior to Visit   Medication Sig Dispense Refill  . fluticasone (FLONASE) 50 MCG/ACT nasal spray Place 2 sprays into both nostrils daily. Reported on 01/14/2016     No current facility-administered medications on file prior to visit.      Objective: BP 130/82 mmHg  Pulse 95  Wt 176 lb (79.833 kg)  BP Readings from Last 3 Encounters:  01/14/16 130/82  12/07/15 148/96  11/01/15 128/90    Gen: wd, wn, nad Psych: pleasant, good eye contact, answers questions appropriately    Assessment: Encounter Diagnoses  Name Primary?  . Generalized anxiety disorder Yes  . Bipolar disorder in partial remission, most recent episode unspecified type (HCC)   . Elevated CK       Plan: discussed his prior diagnosis, recommended he go ahead and get appt with counseling and psychiatry.  Gave list of resources in the area.   Begin back on Lexapro for now.   He has done well with Tegretol in the past too.  advised f/u in 2 wk to recheck on symptoms and CK level.  Reviewed his recent visit with rheumatology in [facility].   they felt some of the elevated CK could be  ethnicity related, but he has hx/o rhabdomyolysis, prior cocaine and alcohol abuse.     He seems to be in a good place now for the most part, trying to be positive, using appropriate coping mechanisms, avoiding conflict, maintaining good employee.   discussed options for treatment, discussed risks/benefits of medication.   F/u 2wk, sooner prn.

## 2016-01-29 DIAGNOSIS — F4323 Adjustment disorder with mixed anxiety and depressed mood: Secondary | ICD-10-CM | POA: Diagnosis not present

## 2016-01-30 ENCOUNTER — Ambulatory Visit (INDEPENDENT_AMBULATORY_CARE_PROVIDER_SITE_OTHER): Payer: BLUE CROSS/BLUE SHIELD | Admitting: Medical

## 2016-01-30 ENCOUNTER — Encounter: Payer: Self-pay | Admitting: Medical

## 2016-01-30 VITALS — BP 126/80 | HR 66 | Wt 175.0 lb

## 2016-01-30 DIAGNOSIS — F411 Generalized anxiety disorder: Secondary | ICD-10-CM | POA: Diagnosis not present

## 2016-01-30 DIAGNOSIS — R748 Abnormal levels of other serum enzymes: Secondary | ICD-10-CM

## 2016-01-30 DIAGNOSIS — Z79899 Other long term (current) drug therapy: Secondary | ICD-10-CM

## 2016-01-30 DIAGNOSIS — F3170 Bipolar disorder, currently in remission, most recent episode unspecified: Secondary | ICD-10-CM

## 2016-01-30 NOTE — Progress Notes (Signed)
Subjective: Chief Complaint  Patient presents with  . Follow-up    counselor, Kenneth Graham. [place] [address]. stated that the lexapro should be uped. wants CK levels checked.    Here for f/u.  I saw him 01/14/16 for concerns about mood.  We started on Lexapro which he had tried prior.  He is here for 2 wk f/u on medication, repeat CK level. Since last visit, taking Lexapro.   Kenneth Graham counselor at Dr. Carie Caddy office yesterday.   She encouraged him to talk to [place] about increasing dose of Lexapro.  Has appt with Dr. Evelene Croon to establish in October.  Overall feels Lexapro is helpful and glad he went to counselor.  Next appt with Kenneth Graham 02/15/16.  No muscle aches, no side effects with Lexapro so far.   Was on Lexapro last year, but felt that it may have been the cause of his elevated CK, however, he has hx/o substance abuse.   Last visit he wanted to go back on something for anxiety, but wants to avoid elevated CK and sedation.  Went through divorce last year, had a lot of stress last year.   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   Went to [facility] practice in the past.  From last visit he was mostly feeling anxious.  Gets his son every weekend, and gets a lot of anxiety dealing with ex-wife, she is combative and hard to deal with.  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   Is under stress.   Can get mood swing from time to time.   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   Last counseling 2012.   Doesn't feel depressed.   Denies SI/HI.    Mother has bipolar and has depression.    Past Medical History:  Diagnosis Date  . Allergy   . Anxiety   . Asthma   . Bipolar disorder (HCC)   . Cocaine abuse   . Depression   . GERD (gastroesophageal reflux disease)   . Psychiatric diagnosis    Dr. Karmen Bongo, hospitalization [facility] in  the past  . Seasonal allergies   . Thrombocytopenia (HCC)   . Wears glasses    Current Outpatient Prescriptions on File Prior to Visit  Medication Sig Dispense Refill  . escitalopram (LEXAPRO) 10 MG tablet Take 1 tablet (10 mg total) by mouth daily. 30 tablet 1  . fluticasone (FLONASE) 50 MCG/ACT nasal spray Place 2 sprays into both nostrils daily. Reported on 01/14/2016     No current facility-administered medications on file prior to visit.      Objective: BP 126/80   Pulse 66   Wt 175 lb (79.4 kg)   BMI 25.29 kg/m   Gen: wd, wn, nad Psych: pleasant, good eye contact, answers questions appropriately    Assessment: Encounter Diagnoses  Name Primary?  . Generalized anxiety disorder Yes  . Bipolar disorder in partial remission, most recent episode unspecified type (HCC)   . Abnormal CK   . High risk medication use      Plan: CK lab today, given hx/o elevated CK  As long as CK ok, we will increase to Lexapro 20mg .  C/t counseling, f/u with Dr. Evelene Croon to establish psychiatry in October.  Glad to hear he is seeing improvement  and had positive experience with counseling.    F/u pending labs.

## 2016-02-01 ENCOUNTER — Ambulatory Visit: Payer: BLUE CROSS/BLUE SHIELD | Admitting: Medical

## 2016-02-04 ENCOUNTER — Telehealth: Payer: Self-pay | Admitting: Medical

## 2016-02-04 LAB — CK ISOENZYMES
CK BB: 0 %
CK-MB: 0 % (ref <5)
CK-MM: 100 % (ref 95–100)
CREATINE KINASE, TOTAL, (QUEST): 111 U/L (ref 44–196)

## 2016-02-04 NOTE — Telephone Encounter (Signed)
Waiting on lab person to find out why labs aren't back yet. She will let me know

## 2016-02-04 NOTE — Telephone Encounter (Signed)
Ck lab panel is tested on Tuesdays and Friday, so it didn't start testing until Friday. So hope to have results this Tuesday or friday

## 2016-02-04 NOTE — Telephone Encounter (Signed)
pls ask lab person about results.  I should have the CK lab panel back by now

## 2016-02-08 ENCOUNTER — Telehealth: Payer: Self-pay | Admitting: Medical

## 2016-02-08 ENCOUNTER — Ambulatory Visit (INDEPENDENT_AMBULATORY_CARE_PROVIDER_SITE_OTHER): Payer: BLUE CROSS/BLUE SHIELD | Admitting: Gastroenterology

## 2016-02-08 ENCOUNTER — Encounter: Payer: Self-pay | Admitting: Gastroenterology

## 2016-02-08 VITALS — BP 120/80 | HR 64 | Ht 70.0 in | Wt 174.0 lb

## 2016-02-08 DIAGNOSIS — K5900 Constipation, unspecified: Secondary | ICD-10-CM

## 2016-02-08 MED ORDER — ESCITALOPRAM OXALATE 20 MG PO TABS: 20.0000 mg | ORAL_TABLET | Freq: Every day | ORAL | 0 refills | Status: DC

## 2016-02-08 NOTE — Progress Notes (Signed)
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  Patient has to hold in a bm when on the road and now even when home he goes less frequently; pt states he eats healthily and does not really have any other symptoms)   Subjective  HPI:  This is a 30 year old man referred for chronic constipation. 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 It got worse when he started doing longer distances about 2 years ago. He often feels the need for a BM, but cannot stop driving in order to find a bathroom. Then when he has the opportunity, he is unable to have a bowel movement. For a while, he was taking some herbal tea, but this seemed to lose its effect. He denies rectal bleeding abdominal pain nausea vomiting early satiety or weight loss.  ROS:  Review of Systems  Constitutional: Negative for appetite change and unexpected weight change.  HENT: Negative for mouth sores and voice change.   Eyes: Negative for pain and redness.  Respiratory: Negative for cough and shortness of breath.   Cardiovascular: Negative for chest pain and palpitations.  Genitourinary: Negative for dysuria and hematuria.  Musculoskeletal: Negative for arthralgias and myalgias.  Skin: Negative for pallor and rash.  Neurological: Negative for weakness and headaches.  Hematological: Negative for adenopathy.     Past Medical History: Past Medical History:  Diagnosis Date  . Allergy   . Anxiety   . Asthma   . Bipolar disorder (HCC)   . Cocaine abuse   . Depression   . GERD (gastroesophageal reflux disease)   . Psychiatric diagnosis    Dr. Karmen Bongo, hospitalization [facility] in the past  . Seasonal  allergies   . Thrombocytopenia (HCC)   . Wears glasses      Past Surgical History: Past Surgical History:  Procedure Laterality Date  . KNEE SURGERY       Family History: Family History  Problem Relation Age of Onset  . Depression Mother   . Bipolar disorder Mother     Social History: Social History   Social History  . Marital status: Legally Separated    Spouse name: N/A  . Number of children: N/A  . Years of education: N/A   Social History Main Topics  . Smoking status: Never Smoker  . Smokeless tobacco: None  . Alcohol use Yes  . Drug use: No     Comment: Former cocaine and crack abuse  . Sexual activity: Not Asked   Other Topics Concern  . 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    Allergies: Allergies  Allergen Reactions  . Haldol [Haloperidol Decanoate] Anaphylaxis  . Penicillins Other (See Comments)    unknown  . Rocephin [Ceftriaxone] Hives    Outpatient Meds: Current Outpatient Prescriptions  Medication Sig Dispense Refill  . escitalopram (LEXAPRO) 20 MG tablet Take 1 tablet (20 mg total) by mouth daily. 30 tablet 0  . fluticasone (FLONASE) 50 MCG/ACT nasal spray Place 2 sprays into both nostrils daily. Reported on 01/14/2016     No current facility-administered medications for this visit.       ___________________________________________________________________ Objective  Exam:  BP 120/80   Pulse 64   Ht  (1.778 m)   Wt 174 lb (78.9 kg)   BMI 24.97 kg/m    General: this is a(n) Well-appearing young man with good muscle mass   Eyes: sclera anicteric, no redness  ENT: oral mucosa moist without lesions, no cervical or supraclavicular lymphadenopathy, good dentition  CV: RRR without murmur, S1/S2, no JVD, no peripheral edema  Resp: clear to auscultation bilaterally, normal RR and effort noted  GI: soft, no tenderness, with active bowel sounds. No  guarding or palpable organomegaly noted.  Skin; warm and dry, no rash or jaundice noted  Neuro: awake, alert and oriented x 3. Normal gross motor function and fluent speech  Labs:  10/2015  nml CBC/CMP, TSH  Assessment: Encounter Diagnosis  Name Primary?  . Constipation, unspecified constipation type Yes    This seems to be benign, related to the patient's occupation and lifestyle. He does eat a healthy diet and gets plenty of fluid.  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 I don't think taking these treatments or prescription meds such as Amitiza or Linzess would be right for him, given his limited opportunities to use the bathroom. It does not seem that further testing such as colonoscopy is necessary at this time, since this does not sound obstructive. He will see me as needed.  Thank you for the courtesy of this consult.  Please call me with any questions or concerns.  Kenneth Graham  CC: Ernst Breach, PA-C

## 2016-02-08 NOTE — Telephone Encounter (Signed)
I called it in 

## 2016-02-08 NOTE — Patient Instructions (Addendum)
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  If that does not help enough, you can increase to 1 capful three times daily for the days you are home.  If you are age 31 or older, your body mass index should be between 23-30. Your Body mass index is 24.97 kg/m. If this is out of the aforementioned range listed, please consider follow up with your Primary Care Provider.  If you are age 61 or younger, your body mass index should be between 19-25. Your Body mass index is 24.97 kg/m. If this is out of the aformentioned range listed, please consider follow up with your Primary Care Provider.   Thank you for choosing [facility]  Dr Amada Jupiter III

## 2016-02-08 NOTE — Telephone Encounter (Signed)
Pt called to follow up on Lexapro dosing getting increased to 20 mg. He said he was told after his appt & labs last week that Lexapro would be increasing to 20 mg. It still has not been increased at the pharmacy yet. Pt double his current dose & started taking the 20 mg since he called inquiring about the increase in med on 8/1 so he is getting low on meds now and need this all corrected asap.

## 2016-03-17 ENCOUNTER — Other Ambulatory Visit: Payer: Self-pay | Admitting: Family Medicine

## 2016-03-17 NOTE — Telephone Encounter (Signed)
Is this okay to refill? 

## 2016-04-11 ENCOUNTER — Encounter: Payer: Self-pay | Admitting: Medical

## 2016-04-11 ENCOUNTER — Ambulatory Visit (INDEPENDENT_AMBULATORY_CARE_PROVIDER_SITE_OTHER): Payer: BLUE CROSS/BLUE SHIELD | Admitting: Medical

## 2016-04-11 VITALS — BP 122/92 | HR 78 | Ht 70.0 in | Wt 179.2 lb

## 2016-04-11 DIAGNOSIS — F3170 Bipolar disorder, currently in remission, most recent episode unspecified: Secondary | ICD-10-CM

## 2016-04-11 DIAGNOSIS — F411 Generalized anxiety disorder: Secondary | ICD-10-CM

## 2016-04-11 DIAGNOSIS — R42 Dizziness and giddiness: Secondary | ICD-10-CM

## 2016-04-11 DIAGNOSIS — T887XXA Unspecified adverse effect of drug or medicament, initial encounter: Secondary | ICD-10-CM | POA: Diagnosis not present

## 2016-04-11 DIAGNOSIS — Z79899 Other long term (current) drug therapy: Secondary | ICD-10-CM | POA: Diagnosis not present

## 2016-04-11 DIAGNOSIS — T50905A Adverse effect of unspecified drugs, medicaments and biological substances, initial encounter: Secondary | ICD-10-CM

## 2016-04-11 MED ORDER — ESCITALOPRAM OXALATE 10 MG PO TABS: 10.0000 mg | ORAL_TABLET | Freq: Every day | ORAL | 2 refills | Status: DC

## 2016-04-11 MED ORDER — CARBAMAZEPINE 200 MG PO TABS: 200.0000 mg | ORAL_TABLET | Freq: Two times a day (BID) | ORAL | 2 refills | Status: DC

## 2016-04-11 NOTE — Patient Instructions (Signed)
Recommendations:  Cut Lexapro to 1/2 tablet daily  Begin Tegretol for mood.  Begin 1 tablet daily at night, then after a week increase to twice daily  Recheck in 3-4 weeks for follow up and labs.   Carbamazepine chewable tablets What is this medicine? CARBAMAZEPINE (kar ba MAZ e peen) is used to control seizures caused by certain types of epilepsy. This medicine is also used to treat nerve related pain. It is not for common aches and pains. This medicine may be used for other purposes; ask your health care provider or pharmacist if you have questions. What should I tell my health care provider before I take this medicine? They need to know if you have any of these conditions: -Asian ancestry -bone marrow disease -glaucoma -heart disease or irregular heartbeat -kidney disease -liver disease -low blood counts, like low white cell, platelet, or red cell counts -porphyria -psychotic disorders -suicidal thoughts, plans, or attempt; a previous suicide attempt by you or a family member -an unusual or allergic reaction to carbamazepine, tricyclic antidepressants, phenytoin, phenobarbital or other medicines, foods, dyes, or preservatives -pregnant or trying to get pregnant -breast-feeding How should I use this medicine? Take this medicine by mouth. Chew it or swallow whole. Follow the directions on the prescription label. Take this medicine with food. Take your doses at regular intervals. Do not take your medicine more often than directed. Do not stop taking this medicine except on the advice of your doctor or health care professional. A special MedGuide will be given to you by the pharmacist with each prescription and refill. Be sure to read this information carefully each time. Talk to your pediatrician regarding the use of this medicine in children. While this drug may be prescribed for children 366 years of age and younger for selected conditions, precautions do apply. Overdosage: If you  think you have taken too much of this medicine contact a poison control center or emergency room at once. NOTE: This medicine is only for you. Do not share this medicine with others. What if I miss a dose? If you miss a dose, take it as soon as you can. If it is almost time for your next dose, take only that dose. Do not take double or extra doses. What may interact with this medicine? Do not take this medicine with any of the following medications: -certain medicines used to treat HIV infection or AIDS that are given in combination with cobicistat - delavirdine - MAOIs like Carbex, Eldepryl, Marplan, Nardil, and Parnate - nefazodone - oxcarbazepine This medicine may also interact with the following medications: - acetaminophen - acetazolamide - barbiturate medicines for inducing sleep or treating seizures, like phenobarbital - certain antibiotics like clarithromycin, erythromycin or troleandomycin - cimetidine - cyclosporine - danazol - dicumarol - doxycycline - male hormones, including estrogens and birth control pills - grapefruit juice - isoniazid, INH - levothyroxine and other thyroid hormones - lithium and other medicines to treat mood problems or psychotic disturbances - loratadine - medicines for angina or high blood pressure - medicines for cancer - medicines for depression or anxiety - medicines for sleep - medicines to treat fungal infections, like fluconazole, itraconazole or ketoconazole - medicines used to treat HIV infection or AIDS - methadone - niacinamide - praziquantel - propoxyphene - rifampin or rifabutin - seizure or epilepsy medicine - steroid medicines such as prednisone or cortisone - theophylline - tramadol - warfarin This list may not describe all possible interactions. Give your health care provider a list  of all the medicines, herbs, non-prescription drugs, or dietary supplements you use. Also tell them if  you smoke, drink alcohol, or use illegal drugs. Some items may interact with your medicine. What should I watch for while using this medicine? Visit your doctor or health care professional for a regular check on your progress. Do not change brands or dosage forms of this medicine without discussing the change with your doctor or health care professional. If you are taking this medicine for epilepsy (seizures) do not stop taking it suddenly. This increases the risk of seizures. Wear a Arboriculturist or necklace. Carry an identification card with information about your condition, medications, and doctor or health care professional. Bonita Quin may get drowsy, dizzy, or have blurred vision. Do not drive, use machinery, or do anything that needs mental alertness until you know how this medicine affects you. To reduce dizzy or fainting spells, do not sit or stand up quickly, especially if you are an older patient. Alcohol can increase drowsiness and dizziness. Avoid alcoholic drinks. Birth control pills may not work properly while you are taking this medicine. Talk to your doctor about using an extra method of birth control. This medicine can make you more sensitive to the sun. Keep out of the sun. If you cannot avoid being in the sun, wear protective clothing and use sunscreen. Do not use sun lamps or tanning beds/booths. The use of this medicine may increase the chance of suicidal thoughts or actions. Pay special attention to how you are responding while on this medicine. Any worsening of mood, or thoughts of suicide or dying should be reported to your health care professional right away. Women who become pregnant while using this medicine may enroll in the [place] American Antiepileptic Drug Pregnancy Registry by calling [phone]. This registry collects information about the safety of antiepileptic drug use during pregnancy. What side effects may I notice from receiving this medicine? Side effects that you  should report to your doctor or health care professional as soon as possible: -allergic reactions like skin rash, itching or hives, swelling of the face, lips, or tongue -breathing problems -change in vision -confusion -dark urine -fast or irregular heartbeat -fever or chills, sore throat -mouth ulcers -pain or difficulty passing urine -redness, blistering, peeling or loosening of the skin, including inside the mouth -ringing in the ears -seizures -stomach pain -swollen joints or muscle/joint aches and pains -unusual bleeding or bruising -unusually weak or tired -vomiting -worsening of mood, thoughts or actions of suicide or dying -yellowing of the eyes or skin Side effects that usually do not require medical attention (report to your doctor or health care professional if they continue or are bothersome): -clumsiness or unsteadiness -diarrhea or constipation -headache -increased sweating -nausea This list may not describe all possible side effects. Call your doctor for medical advice about side effects. You may report side effects to FDA at 1-800-FDA-1088. Where should I keep my medicine? Keep out of reach of children. Store at room temperature below 30 degrees C (86 degrees F). Keep container tightly closed. Protect from moisture. Throw away any unused medicine after the expiration date. NOTE: This sheet is a summary. It may not cover all possible information. If you have questions about this medicine, talk to your doctor, pharmacist, or health care provider.    2016, Elsevier/Gold Standard. (2014-06-15 15:31:54)

## 2016-04-11 NOTE — Progress Notes (Signed)
Subjective: Chief Complaint  Patient presents with  . Follow-up    wants to discuss changing lexapro, feels like the medication is causing dizziness    Here for f/u on medications.  He has been seeing counseling, but missed his psychiatry establish appt with Dr. Evelene CroonKaur.  In the past he felt that Lexapro was making him dizzy.  Lately he feels that the medication is making him dizzy.  He has hx/o anxiety attacks/panic attacks in the past.  But at times has feeling of dizzy or anxiety.  Thinks its adverse effect of the Lexapro. [facility]. No chest pain, SOB, edema, palpations.  Has had prior eval for similar dizziness.  Denies hearing loss but sometimes gets wax, has hx/o surgery for ear infection.  Has been on Tegretol in the past, has been on others in the past, but can't recall names of all of the medications.  Has used Seroqeuel, Geodon. Can't take Haldol.  Can't get in to see psychiatry/Dr. Evelene CroonKaur until December.   Mother has bipolar and has depression.    Still takes Flonase for allergies sometimes, and no recent allergy symptoms.       Eats on average twice daily, snacks too, eats mostly plant based foods and seafood.    Past Medical History:  Diagnosis Date  . Allergy   . Anxiety   . Asthma   . Bipolar disorder (HCC)   . Cocaine abuse   . Depression   . GERD (gastroesophageal reflux disease)   . Psychiatric diagnosis    Dr. Karmen BongoBrian Fehrer, hospitalization [facility] in the past  . Seasonal allergies   . Thrombocytopenia (HCC)   . Wears glasses    Current Outpatient Prescriptions on File Prior to Visit  Medication Sig Dispense Refill  . fluticasone (FLONASE) 50 MCG/ACT nasal spray Place 2 sprays into both nostrils daily. Reported on 01/14/2016     No current facility-administered medications on file prior to visit.    ROS as in subjective   Objective: BP (!) 122/92 (BP Location: Right Arm)   Pulse 78   Ht 5\' 10"  (1.778 m)   Wt 179 lb 4 oz (81.3 kg)   SpO2 99%   BMI 25.72  kg/m   Wt Readings from Last 3 Encounters:  04/11/16 179 lb 4 oz (81.3 kg)  02/08/16 174 lb (78.9 kg)  01/30/16 175 lb (79.4 kg)   General appearance: alert, no distress, WD/WN, AA male HEENT: normocephalic, sclerae anicteric, PERRLA, EOMi, nares patent, no discharge or erythema, moderate wax in left ear canal, right TM and canal normal, pharynx normal Oral cavity: MMM, no lesions Neck: supple, no lymphadenopathy, no thyromegaly, no masses, no bruits Heart: RRR, normal S1, S2, no murmurs Lungs: CTA bilaterally, no wheezes, rhonchi, or rales Extremities: no edema, no cyanosis, no clubbing Pulses: 2+ symmetric, upper and lower extremities, normal cap refill Neurological: alert, oriented x 3, CN2-12 intact, strength normal upper extremities and lower extremities, sensation normal throughout, DTRs 2+ throughout, no cerebellar signs, gait normal Psychiatric: normal affect, behavior normal, pleasant     Assessment: Encounter Diagnoses  Name Primary?  . Dizziness and giddiness Yes  . Adverse effect of drug, initial encounter   . Generalized anxiety disorder   . Bipolar disorder in partial remission, most recent episode unspecified type (HCC)   . High risk medication use      Plan:  PHQ 9 score of 6.  discussed his symptoms, concerns.   He has tried Lexapro 20mg  both morning and night  in the past.   Having some dizziness that may be side effect.  Begin trial of Tegretol as mood stabilizer and cut Lexapro down to 10mg  daily.  discussed his concerns.  Discussed risks/benefits of medication.  F/u with psychiatry as planned in December.  F/u with me in 80mo,sooner prn.   Spyridon was seen today for follow-up.  Diagnoses and all orders for this visit:  Dizziness and giddiness -     Orthostatic vital signs  Adverse effect of drug, initial encounter  Generalized anxiety disorder  Bipolar disorder in partial remission, most recent episode unspecified type (HCC)  High risk medication  use  Other orders -     carbamazepine (TEGRETOL) 200 MG tablet; Take 1 tablet (200 mg total) by mouth 2 (two) times daily. -     escitalopram (LEXAPRO) 10 MG tablet; Take 1 tablet (10 mg total) by mouth daily.

## 2016-05-15 ENCOUNTER — Telehealth: Payer: Self-pay | Admitting: Internal Medicine

## 2016-05-15 NOTE — Telephone Encounter (Signed)
Pt has an nurse visit tomorrow for labs but no orders in epic. Please put in future orders

## 2016-05-16 ENCOUNTER — Other Ambulatory Visit: Payer: BLUE CROSS/BLUE SHIELD

## 2016-05-16 NOTE — Telephone Encounter (Signed)
This should be visit and lab, not nurse visit

## 2016-05-16 NOTE — Telephone Encounter (Signed)
Pt has appt next wed for OV

## 2016-05-21 ENCOUNTER — Encounter: Payer: Self-pay | Admitting: Medical

## 2016-05-21 ENCOUNTER — Ambulatory Visit (INDEPENDENT_AMBULATORY_CARE_PROVIDER_SITE_OTHER): Payer: BLUE CROSS/BLUE SHIELD | Admitting: Medical

## 2016-05-21 VITALS — BP 138/70 | HR 70 | Wt 185.0 lb

## 2016-05-21 DIAGNOSIS — F3170 Bipolar disorder, currently in remission, most recent episode unspecified: Secondary | ICD-10-CM

## 2016-05-21 DIAGNOSIS — Z3009 Encounter for other general counseling and advice on contraception: Secondary | ICD-10-CM | POA: Insufficient documentation

## 2016-05-21 DIAGNOSIS — Z13220 Encounter for screening for lipoid disorders: Secondary | ICD-10-CM

## 2016-05-21 DIAGNOSIS — Z79899 Other long term (current) drug therapy: Secondary | ICD-10-CM

## 2016-05-21 DIAGNOSIS — F411 Generalized anxiety disorder: Secondary | ICD-10-CM | POA: Diagnosis not present

## 2016-05-21 LAB — LIPID PANEL
CHOL/HDL RATIO: 3.8 ratio (ref <5.0)
Cholesterol: 171 mg/dL (ref <200)
HDL: 45 mg/dL (ref >40)
LDL CALC: 103 mg/dL — AB (ref <100)
TRIGLYCERIDES: 114 mg/dL (ref <150)
VLDL: 23 mg/dL (ref <30)

## 2016-05-21 MED ORDER — CARBAMAZEPINE ER 200 MG PO TB12: 200.0000 mg | ORAL_TABLET | Freq: Every day | ORAL | 2 refills | Status: DC

## 2016-05-21 NOTE — Progress Notes (Signed)
Subjective: Chief Complaint  Patient presents with  . 1 month follow up     1 month follow up  from new meds and lab work    Here for f/u on medications.   Since last visit doing fine on tegretol 200mg  once daily but can't remember to take it BID.  Wants once daily dosing.  He stopped lexapro cold [place] instead of staying on 10mg  we discussed.  He has recent dizziness and body tremor feeling that resolved.   A year ago had dizziness and body tremor like feelings when he cold [place] stopped Lexapro.   Overall mood doing ok, no recent panic attacks.   Mood better.   [facility] and has first psychiatry appt with [facility] coming up soon.   Here for lipid lab today,non fasting but has had no meat or dairy today.    Mother has bipolar and has depression.     Past Medical History:  Diagnosis Date  . Allergy   . Anxiety   . Asthma   . Bipolar disorder (HCC)   . Cocaine abuse   . Depression   . GERD (gastroesophageal reflux disease)   . Psychiatric diagnosis    Dr. Karmen BongoBrian Graham, hospitalization [facility] in the past  . Seasonal allergies   . Thrombocytopenia (HCC)   . Wears glasses    Current Outpatient Prescriptions on File Prior to Visit  Medication Sig Dispense Refill  . fluticasone (FLONASE) 50 MCG/ACT nasal spray Place 2 sprays into both nostrils daily. Reported on 01/14/2016     No current facility-administered medications on file prior to visit.    Family History  Problem Relation Age of Onset  . Depression Mother   . Bipolar disorder Mother     ROS as in subjective    Objective: BP 138/70   Pulse 70   Wt 185 lb (83.9 kg)   SpO2 99%   BMI 26.54 kg/m   Wt Readings from Last 3 Encounters:  05/21/16 185 lb (83.9 kg)  04/11/16 179 lb 4 oz (81.3 kg)  02/08/16 174 lb (78.9 kg)   General appearance: alert, no distress, WD/WN, AA male Neck: supple, no lymphadenopathy, no thyromegaly, no masses, no bruits Heart: RRR, normal S1, S2, no  murmurs Lungs: CTA bilaterally, no wheezes, rhonchi, or rales Extremities: no edema, no cyanosis, no clubbing Pulses: 2+ symmetric, upper and lower extremities, normal cap refill Neurological: alert, oriented x 3, CN2-12 intact, strength normal upper extremities and lower extremities, sensation normal throughout, DTRs 2+ throughout, no cerebellar signs, gait normal Psychiatric: normal affect, behavior normal, pleasant     Assessment: Encounter Diagnoses  Name Primary?  . Bipolar disorder in partial remission, most recent episode unspecified type (HCC) Yes  . Generalized anxiety disorder   . Screening for lipid disorders   . High risk medication use      Plan:  Change to XR tegretol.  He has stopped Lexapro on his own.   Still c/t appt with [facility] to establish care.    Glad to hear he is doing fine on current regimen  Lipid lab today.  Of note, he is eating mostly vegetarian except fish.  Kenneth MaduroRobert was seen today for 1 month follow up.  Diagnoses and all orders for this visit:  Bipolar disorder in partial remission, most recent episode unspecified type (HCC)  Generalized anxiety disorder  Screening for lipid disorders -     Lipid panel  High risk medication use -  Lipid panel  Other orders -     carbamazepine (TEGRETOL-XR) 200 MG 12 hr tablet; Take 1 tablet (200 mg total) by mouth daily.

## 2016-06-23 ENCOUNTER — Ambulatory Visit (INDEPENDENT_AMBULATORY_CARE_PROVIDER_SITE_OTHER): Payer: BLUE CROSS/BLUE SHIELD | Admitting: Medical

## 2016-06-23 ENCOUNTER — Encounter: Payer: Self-pay | Admitting: Medical

## 2016-06-23 VITALS — BP 130/80 | HR 95 | Wt 187.8 lb

## 2016-06-23 DIAGNOSIS — H6122 Impacted cerumen, left ear: Secondary | ICD-10-CM | POA: Diagnosis not present

## 2016-06-23 DIAGNOSIS — F411 Generalized anxiety disorder: Secondary | ICD-10-CM

## 2016-06-23 DIAGNOSIS — R030 Elevated blood-pressure reading, without diagnosis of hypertension: Secondary | ICD-10-CM

## 2016-06-23 MED ORDER — PROPRANOLOL HCL 20 MG PO TABS: 20.0000 mg | ORAL_TABLET | Freq: Two times a day (BID) | ORAL | 2 refills | Status: DC

## 2016-06-23 NOTE — Progress Notes (Signed)
Subjective: Chief Complaint  Patient presents with  . high b/p reading    high b/p reading x1 month    Here for f/u on elevated BPs.  This morning 157/82.   Thinks its stress related.  In recent weeks, stopped Lexapro as it seemed to quit working.  He also felt like Lexapro gave him some tremors.  Stopped Tegretol because it didn't seem to help.  Can't see Dr Evelene CroonKaur til 08/16/16.  Sees his son every weekend.  Stressors include long distance truck driver.   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  This is stressful currently.    Lately been feeling unusual, pressure in ears, sometimes feels pressure in eyes.   No headaches.  No nose congestion, sneezing, sinus pressure.     Of note, no hx/o hypertension.   Used cocaine, clean since 01/2010, but used from 30yo until around age 30yo.  No hx/o echocardiogram      Past Medical History:  Diagnosis Date  . Allergy   . Anxiety   . Asthma   . Bipolar disorder (HCC)   . Cocaine abuse   . Depression   . GERD (gastroesophageal reflux disease)   . Psychiatric diagnosis    Dr. Karmen BongoBrian Fehrer, hospitalization [facility] in the past  . Seasonal allergies   . Thrombocytopenia (HCC)   . Wears glasses    Current Outpatient Prescriptions on File Prior to Visit  Medication Sig Dispense Refill  . fluticasone (FLONASE) 50 MCG/ACT nasal spray Place 2 sprays into both nostrils daily. Reported on 01/14/2016    . carbamazepine (TEGRETOL-XR) 200 MG 12 hr tablet Take 1 tablet (200 mg total) by mouth daily. (Patient not taking: Reported on 06/23/2016) 30 tablet 2   No current facility-administered medications on file prior to visit.    ROS as in subjective   Objective: BP 130/80   Pulse 95   Wt 187 lb 12.8 oz (85.2 kg)   SpO2 99%   BMI 26.95 kg/m    BP Readings from Last 3 Encounters:  06/23/16 130/80  05/21/16 138/70  04/11/16 (!) 122/92   General appearance: alert, no distress, WD/WN,  HEENT:  impacted cerumen left ear, otherwise normocephalic, sclerae anicteric, TMs pearly, nares patent, no discharge or erythema, pharynx normal Oral cavity: MMM, no lesions Neck: supple, no lymphadenopathy, no thyromegaly, no masses, no bruits Heart: RRR, normal S1, S2, no murmurs Lungs: CTA bilaterally, no wheezes, rhonchi, or rales Ext: no edema Pulses: 2+ symmetric, upper and lower extremities, normal cap refill   Adult ECG Report  Indication: elevated BP  Rate: 61 bpm  Rhythm: normal sinus rhythm  QRS Axis: 69 degrees  PR Interval: 172ms  QRS Duration: 96ms  QTc: 352ms  Conduction Disturbances: possible left atrial enlargement  Other Abnormalities: RSR' pattern suggsting RBBB  Patient's cardiac risk factors are: male gender and hx/o cocaine abuse in the past.  EKG comparison: 09/2015  Narrative Interpretation: possible left atrial enlargement, RBBB    Assessment Encounter Diagnoses  Name Primary?  . Elevated blood-pressure reading without diagnosis of hypertension Yes  . Generalized anxiety disorder   . Impacted cerumen of left ear     Plan: No specific HTN diagnosis.    He has f/u planned with psychiatry to establish care, but there is a waiting list so he can't get in til 08/2016.   He failed trials of Lexapro and Tegretol.  Begin trial of propranolol to help with anxiety, uneasiness.  Discussed risks/benefits of medication.  F/u 61mo.  Discussed findings.  Discussed risk/benefits of procedure and patient agrees to procedure. Successfully used warm water lavage to remove impacted cerumen from left ear canal. Patient tolerated procedure well. Advised they avoid using any cotton swabs or other devices to clean the ear canals.  Use basic hygiene as discussed.  Follow up prn.    Molly MaduroRobert was seen today for high b/p reading.  Diagnoses and all orders for this visit:  Elevated blood-pressure reading without diagnosis of hypertension -     EKG 12-Lead  Generalized anxiety  disorder -     EKG 12-Lead  Impacted cerumen of left ear  Other orders -     propranolol (INDERAL) 20 MG tablet; Take 1 tablet (20 mg total) by mouth 2 (two) times daily.

## 2016-12-05 DIAGNOSIS — F3111 Bipolar disorder, current episode manic without psychotic features, mild: Secondary | ICD-10-CM | POA: Diagnosis not present

## 2016-12-09 ENCOUNTER — Telehealth: Payer: Self-pay | Admitting: Medical

## 2016-12-09 NOTE — Telephone Encounter (Signed)
Received signed request to fax most recent labs. Labs faxed to [facility] at [phone].

## 2016-12-17 ENCOUNTER — Ambulatory Visit (INDEPENDENT_AMBULATORY_CARE_PROVIDER_SITE_OTHER): Payer: BLUE CROSS/BLUE SHIELD | Admitting: Medical

## 2016-12-17 ENCOUNTER — Encounter: Payer: Self-pay | Admitting: Medical

## 2016-12-17 VITALS — BP 138/80 | HR 94 | Wt 179.0 lb

## 2016-12-17 DIAGNOSIS — R030 Elevated blood-pressure reading, without diagnosis of hypertension: Secondary | ICD-10-CM | POA: Diagnosis not present

## 2016-12-17 DIAGNOSIS — R252 Cramp and spasm: Secondary | ICD-10-CM

## 2016-12-17 DIAGNOSIS — F411 Generalized anxiety disorder: Secondary | ICD-10-CM | POA: Diagnosis not present

## 2016-12-17 LAB — BASIC METABOLIC PANEL
BUN: 7 mg/dL (ref 7–25)
CO2: 26 mmol/L (ref 20–31)
CREATININE: 1.05 mg/dL (ref 0.60–1.35)
Calcium: 10 mg/dL (ref 8.6–10.3)
Chloride: 103 mmol/L (ref 98–110)
Glucose, Bld: 91 mg/dL (ref 65–99)
POTASSIUM: 4.6 mmol/L (ref 3.5–5.3)
Sodium: 138 mmol/L (ref 135–146)

## 2016-12-17 LAB — CBC
HEMATOCRIT: 45.5 % (ref 38.5–50.0)
Hemoglobin: 15.4 g/dL (ref 13.2–17.1)
MCH: 28.4 pg (ref 27.0–33.0)
MCHC: 33.8 g/dL (ref 32.0–36.0)
MCV: 83.8 fL (ref 80.0–100.0)
MPV: 12.9 fL — AB (ref 7.5–12.5)
PLATELETS: 190 10*3/uL (ref 140–400)
RBC: 5.43 MIL/uL (ref 4.20–5.80)
RDW: 14.7 % (ref 11.0–15.0)
WBC: 6.4 10*3/uL (ref 4.0–10.5)

## 2016-12-17 MED ORDER — PROPRANOLOL HCL 40 MG PO TABS: 40.0000 mg | ORAL_TABLET | Freq: Two times a day (BID) | ORAL | 2 refills | Status: DC

## 2016-12-17 NOTE — Progress Notes (Signed)
Subjective: Chief Complaint  Patient presents with  . wants to be check for dvt    possible dvt legs  feeling  tightness , b/p reading    Saw Dr. Evelene Croon, was put on Tegretol.  Not sure its helping.  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   He has talked to his employer about a local assignment  Getting tight feelings in legs, worried about blood clots.   Not sure if this is anxiety?  Does exercise regularly.   When sitting or at home feel tightness in his legs.  Last visit 06/2016 he was having anxiety and elevated BPs.   We recommended trial of Propranolol to help with both.  He tried the propanolol for several weeks but doesn't think it helped.    Past Medical History:  Diagnosis Date  . Allergy   . Anxiety   . Asthma   . Bipolar disorder (HCC)   . Cocaine abuse    in past  . Depression   . GERD (gastroesophageal reflux disease)   . Psychiatric diagnosis    Dr. Karmen Bongo, hospitalization [facility] in the past  . Seasonal allergies   . Thrombocytopenia (HCC)   . Wears glasses    Current Outpatient Prescriptions on File Prior to Visit  Medication Sig Dispense Refill  . carbamazepine (TEGRETOL-XR) 200 MG 12 hr tablet Take 1 tablet (200 mg total) by mouth daily. 30 tablet 2  . fluticasone (FLONASE) 50 MCG/ACT nasal spray Place 2 sprays into both nostrils daily. Reported on 01/14/2016     No current facility-administered medications on file prior to visit.    ROS as in subjective   Objective: BP 138/80   Pulse 94   Wt 179 lb (81.2 kg)   SpO2 99%   BMI 25.68 kg/m   General appearance: alert, no distress, WD/WN,  Neck: supple, no lymphadenopathy, no thyromegaly, no masses Heart: RRR, normal S1, S2, no murmurs Lungs: CTA bilaterally, no wheezes, rhonchi, or rales Legs nontender, no swelling, no obvious deformity Extremities: no edema, no cyanosis, no clubbing Pulses: 2+ symmetric, upper and lower extremities, normal cap  refill Neurological: alert, oriented x 3, CN2-12 intact, strength normal upper extremities and lower extremities, sensation normal throughout, DTRs 2+ throughout, no cerebellar signs, gait normal Psychiatric: normal affect, behavior normal, pleasant    Assessment: Encounter Diagnoses  Name Primary?  . Elevated blood-pressure reading without diagnosis of hypertension Yes  . Generalized anxiety disorder   . Leg cramp      Plan: Counseled on stress reduction.   Restart propranolol at higher dose for help with anxiety, fluctuating BPs likely related to stress.   Advised he purchase a BP cuff to monitor his BP/pulse himself since he feels that the [employer] machine is higher than we get here in the office.  He is working to get a local truck driving shift to reduce stress.  He is considering living with family in [place] and working and living there for better quality of life/stress reduction.     Naim was seen today for wants to be check for dvt.  Diagnoses and all orders for this visit:  Elevated blood-pressure reading without diagnosis of hypertension -     Basic metabolic panel -     CBC -     D-dimer, quantitative (not at [facility])  Generalized anxiety disorder -     Basic metabolic panel -     CBC -     D-dimer,  quantitative (not at [facility])  Leg cramp -     Basic metabolic panel -     CBC -     D-dimer, quantitative (not at [facility])  Other orders -     propranolol (INDERAL) 40 MG tablet; Take 1 tablet (40 mg total) by mouth 2 (two) times daily.

## 2016-12-18 LAB — D-DIMER, QUANTITATIVE: D-Dimer, Quant: <0.19 mcg/mL FEU (ref <0.50)

## 2017-01-10 DIAGNOSIS — F3111 Bipolar disorder, current episode manic without psychotic features, mild: Secondary | ICD-10-CM | POA: Diagnosis not present

## 2017-01-10 DIAGNOSIS — F3131 Bipolar disorder, current episode depressed, mild: Secondary | ICD-10-CM | POA: Diagnosis not present

## 2017-01-10 DIAGNOSIS — F410 Panic disorder [episodic paroxysmal anxiety] without agoraphobia: Secondary | ICD-10-CM | POA: Diagnosis not present

## 2017-02-25 ENCOUNTER — Ambulatory Visit: Payer: BLUE CROSS/BLUE SHIELD | Admitting: Family Medicine

## 2017-02-26 ENCOUNTER — Encounter: Payer: Self-pay | Admitting: Family Medicine

## 2017-02-26 ENCOUNTER — Ambulatory Visit (INDEPENDENT_AMBULATORY_CARE_PROVIDER_SITE_OTHER): Payer: Self-pay | Admitting: Family Medicine

## 2017-02-26 VITALS — BP 130/88 | HR 60 | Wt 178.4 lb

## 2017-02-26 DIAGNOSIS — F411 Generalized anxiety disorder: Secondary | ICD-10-CM

## 2017-02-26 DIAGNOSIS — R030 Elevated blood-pressure reading, without diagnosis of hypertension: Secondary | ICD-10-CM

## 2017-02-26 NOTE — Progress Notes (Signed)
   Subjective:    Patient ID: [facility], male    DOB: 19-Aug-1985, 31 y.o.   MRN: [id number]  HPI Chief Complaint  Patient presents with  . blood pressure issues    blood pressure issues, usually in the 140's / high 80's. thinks it might be aniexty related   He is a patient of [facility] who is here to discuss elevated BP at home. He checks it regularly. States readings have been 117/79, 145/84, 138/84  States he feels anxious often. Has a history of GAD.   States he has been eating healthy, eats mainly fruits and vegetables, low salt. Does not get much exercise. States he is a [employer]. 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   States he has taken Lexapro in the past for approximately one year and stopped it cold [place]. States he had terrible symptoms when he stopped it and does not want to take medication again for anxiety.  States he went to counseling 2-3 times last year but nothing recently.   Does not smoke. He drinks alcohol regularly.  States he drinks 8-9 glasses of water per drink.   States he saw a psychiatrist 2 months ago and was prescribed Depakote but he did not start taking it. Does not plan on taking this.   Denies fever, chills, dizziness, vision changes, chest pain, palpitations, shortness of breath, abdominal pain, N/V/D, urinary symptoms, LE edema.   Reviewed allergies, medications, past medical, surgical,  and social history.    Review of Systems Pertinent positives and negatives in the history of present illness.     Objective:   Physical Exam BP 130/88   Pulse 60   Wt 178 lb 6.4 oz (80.9 kg)   BMI 25.60 kg/m   Alert and in no distress.  Pharyngeal area is normal. Neck is supple without adenopathy or thyromegaly. Cardiac exam shows a regular sinus rhythm without murmurs or gallops. Lungs are clear to auscultation. Normal mood, affect and thought process.       Assessment & Plan:  Generalized anxiety disorder  Elevated blood-pressure  reading without diagnosis of hypertension  Discussed serenity prayer and not worry about things he cannot control. Discussed ways to decrease anxiety.  He will call and schedule with his counselor [facility]. He is adamant that he does not want medication for anxiety and has tried several in the past.  His BP improved- 124/84 by the end of the visit. Encouraged him to continue on Propanolol and follow up with [facility] in 2-4 weeks.

## 2017-02-26 NOTE — Patient Instructions (Signed)
See your counselor and do the things we talked about to try and manage your anxiety.    Your blood pressure is close to goal range. The goal BP is <130/80.   Continue taking good care of yourself and stay on your current medication.   Follow up with Vincenza Hews in 2-4 weeks for a recheck.

## 2017-04-10 ENCOUNTER — Encounter: Payer: Self-pay | Admitting: Medical

## 2017-04-10 ENCOUNTER — Ambulatory Visit (INDEPENDENT_AMBULATORY_CARE_PROVIDER_SITE_OTHER): Payer: BLUE CROSS/BLUE SHIELD | Admitting: Medical

## 2017-04-10 VITALS — BP 134/86 | HR 63 | Wt 179.8 lb

## 2017-04-10 DIAGNOSIS — F411 Generalized anxiety disorder: Secondary | ICD-10-CM

## 2017-04-10 DIAGNOSIS — F3170 Bipolar disorder, currently in remission, most recent episode unspecified: Secondary | ICD-10-CM

## 2017-04-10 DIAGNOSIS — I10 Essential (primary) hypertension: Secondary | ICD-10-CM | POA: Diagnosis not present

## 2017-04-10 MED ORDER — PROPRANOLOL HCL 60 MG PO TABS: 60.0000 mg | ORAL_TABLET | Freq: Two times a day (BID) | ORAL | 2 refills | Status: DC

## 2017-04-10 NOTE — Progress Notes (Signed)
Subjective: Chief Complaint  Patient presents with  . Follow-up    follow up b/p , still having some high reading    Here for recheck on blood pressure.  Went to have DOT physician recently and BP was around160/84, but he hadn't slept well the night before and was rushing to get to the office.  For past several months he was taking propranolol, but recently stopped it as it wasn't helping anxiety which was one of the reasons we started this medication.   He has been checking BPs.   Bps typically run in the 140/80s.  He is eating healthy, limiting salt, exercising.  Nonsmoker.  Not drinking alcohol.  He sees Dr. Evelene Croon, who has him on Depakote which he feels is working well for mood, anxiety.  Denies chest pain, SOB, palpitations.  No other aggravating or relieving factors. No other complaint.  Past Medical History:  Diagnosis Date  . Allergy   . Anxiety   . Asthma   . Bipolar disorder (HCC)   . Cocaine abuse (HCC)    in past  . Depression   . GERD (gastroesophageal reflux disease)   . Psychiatric diagnosis    Dr. Karmen Bongo, hospitalization [facility] in the past  . Seasonal allergies   . Thrombocytopenia (HCC)   . Wears glasses    Current Outpatient Prescriptions on File Prior to Visit  Medication Sig Dispense Refill  . fluticasone (FLONASE) 50 MCG/ACT nasal spray Place 2 sprays into both nostrils daily. Reported on 01/14/2016     No current facility-administered medications on file prior to visit.    ROS as in subjective   Objective BP 134/86   Pulse 63   Wt 179 lb 12.8 oz (81.6 kg)   SpO2 98%   BMI 25.80 kg/m   BP Readings from Last 3 Encounters:  04/10/17 134/86  02/26/17 130/88  12/17/16 138/80   General appearance: alert, no distress, WD/WN,  Heart: RRR, normal S1, S2, no murmurs Lungs: CTA bilaterally, no wheezes, rhonchi, or rales Extremities: no edema, no cyanosis, no clubbing Pulses: 2+ symmetric, upper and lower extremities, normal cap  refill Psychiatric: normal affect, behavior normal, pleasant    Assessment: Encounter Diagnoses  Name Primary?  . Essential hypertension Yes  . Generalized anxiety disorder   . Bipolar disorder in partial remission, most recent episode unspecified type (HCC)     Plan: Discussed recent BP readings, mood.  He is under psychiatric care with Dr. Evelene Croon, doing well on Depakote.   Discussed risks/benefits of propanolol vs alternate such as losartan.   We will increase Propranolol to  BID.  F/u with My Chart message and BP readings in 57mo  Haidyn was seen today for follow-up.  Diagnoses and all orders for this visit:  Essential hypertension  Generalized anxiety disorder  Bipolar disorder in partial remission, most recent episode unspecified type (HCC)  Other orders -     propranolol (INDERAL) 60 MG tablet; Take 1 tablet (60 mg total) by mouth 2 (two) times daily.

## 2017-05-12 ENCOUNTER — Telehealth: Payer: Self-pay | Admitting: Medical

## 2017-05-12 ENCOUNTER — Other Ambulatory Visit: Payer: Self-pay | Admitting: Medical

## 2017-05-12 MED ORDER — PROPRANOLOL HCL 80 MG PO TABS: 80.0000 mg | ORAL_TABLET | Freq: Two times a day (BID) | ORAL | 1 refills | Status: DC

## 2017-05-12 NOTE — Telephone Encounter (Signed)
Pt called with BP readings states running 140's and 130's over 90's and the 130's are at rest.  Doesn't feel like the medicine is really working well.  Wants to know what you want to do, if you want to add another medicine or change him to a different medication, please advise pt ?

## 2017-05-12 NOTE — Telephone Encounter (Signed)
I increased to Propanolol 80mg  BID instead of 60mg .  Have him monitor BP and pulse, recheck in 71mo.   We will have to watch his pulse so it doesn't stay too low such as consistently below 60.  If sdizzyor lightheaded, recheck sooner.

## 2017-05-13 NOTE — Telephone Encounter (Signed)
Called pt informed

## 2017-08-18 ENCOUNTER — Other Ambulatory Visit: Payer: Self-pay | Admitting: Medical

## 2017-10-06 ENCOUNTER — Other Ambulatory Visit: Payer: Self-pay | Admitting: Medical

## 2017-10-07 NOTE — Telephone Encounter (Signed)
LOV- 04/10/17, okay to refill?

## 2017-10-07 NOTE — Telephone Encounter (Signed)
I sent refill but get him back in for med check

## 2017-10-07 NOTE — Telephone Encounter (Signed)
Pt made appt for 10/16/17 & advised pt that refill is at pharmacy

## 2017-10-16 ENCOUNTER — Encounter: Payer: Self-pay | Admitting: Medical

## 2017-10-16 ENCOUNTER — Other Ambulatory Visit: Payer: Self-pay

## 2017-10-16 ENCOUNTER — Ambulatory Visit: Payer: BLUE CROSS/BLUE SHIELD | Admitting: Medical

## 2017-10-16 VITALS — BP 140/92 | HR 77 | Ht 70.0 in | Wt 191.8 lb

## 2017-10-16 DIAGNOSIS — R002 Palpitations: Secondary | ICD-10-CM | POA: Diagnosis not present

## 2017-10-16 DIAGNOSIS — I10 Essential (primary) hypertension: Secondary | ICD-10-CM | POA: Diagnosis not present

## 2017-10-16 MED ORDER — PROPRANOLOL HCL 80 MG PO TABS: 80.0000 mg | ORAL_TABLET | Freq: Two times a day (BID) | ORAL | 3 refills | Status: DC

## 2017-10-16 NOTE — Progress Notes (Signed)
Subjective: Chief Complaint  Patient presents with  . Medication Management    high blood pressure still high, heart feels beating faster   Here for f/u on blood pressure.   Has been under stress, not eating as healthy, has gained some weight.  Just got joint custody of 5 year son. BPs running in the 140/90s.   Does get some palpitations from time to time.   No SOB, no dyspnea, no edema, no paresthesia.   No other aggravating or relieving factors. No other complaint.   Past Medical History:  Diagnosis Date  . Allergy   . Anxiety   . Asthma   . Bipolar disorder (HCC)   . Cocaine abuse (HCC)    in past  . Depression   . GERD (gastroesophageal reflux disease)   . Psychiatric diagnosis    Dr. Karmen BongoBrian Fehrer, hospitalization [facility] in the past  . Seasonal allergies   . Thrombocytopenia (HCC)   . Wears glasses    Current Outpatient Medications on File Prior to Visit  Medication Sig Dispense Refill  . divalproex (DEPAKOTE) 500 MG DR tablet Take 500 mg by mouth 3 (three) times daily.    . fluticasone (FLONASE) 50 MCG/ACT nasal spray Place 2 sprays into both nostrils daily. Reported on 01/14/2016     No current facility-administered medications on file prior to visit.    ROS as in subjective    Objective: BP (!) 140/92 (BP Location: Right Arm, Patient Position: Sitting, Cuff Size: Normal)   Pulse 77   Ht 5\' 10"  (1.778 m)   Wt 191 lb 12.8 oz (87 kg)   SpO2 97%   BMI 27.52 kg/m   BP Readings from Last 3 Encounters:  10/16/17 (!) 140/92  04/10/17 134/86  02/26/17 130/88   Wt Readings from Last 3 Encounters:  10/16/17 191 lb 12.8 oz (87 kg)  04/10/17 179 lb 12.8 oz (81.6 kg)  02/26/17 178 lb 6.4 oz (80.9 kg)   General appearance: alert, no distress, WD/WN,  Neck: supple, no lymphadenopathy, no thyromegaly, no masses Heart: RRR, normal S1, S2, no murmurs Lungs: CTA bilaterally, no wheezes, rhonchi, or rales Ext: no edema Pulses: 2+ symmetric, upper and lower  extremities, normal cap refill      Assessment: Encounter Diagnoses  Name Primary?  . Essential hypertension, benign Yes  . Palpitation      Plan: Referral for event monitor given palpitations Reviewed last EKG and labs in chart C/t same medications but advised weight loss through healthier diet and exercise.  He acknowledges he hasn't been eating healthy. F/u pending event monitor  Molly MaduroRobert was seen today for medication management.  Diagnoses and all orders for this visit:  Essential hypertension, benign  Palpitation  Other orders -     propranolol (INDERAL) 80 MG tablet; Take 1 tablet (80 mg total) by mouth 2 (two) times daily.

## 2018-07-22 ENCOUNTER — Encounter: Payer: Self-pay | Admitting: Medical

## 2018-07-22 ENCOUNTER — Ambulatory Visit: Payer: BLUE CROSS/BLUE SHIELD | Admitting: Medical

## 2018-07-22 VITALS — BP 140/90 | HR 75 | Temp 98.5°F | Ht 69.0 in | Wt 192.2 lb

## 2018-07-22 DIAGNOSIS — F411 Generalized anxiety disorder: Secondary | ICD-10-CM

## 2018-07-22 DIAGNOSIS — I10 Essential (primary) hypertension: Secondary | ICD-10-CM

## 2018-07-22 DIAGNOSIS — R002 Palpitations: Secondary | ICD-10-CM

## 2018-07-22 NOTE — Progress Notes (Signed)
Subjective: Chief Complaint  Patient presents with  . Hypertension   Here for med check  HTN - compliant with Propranolol 80mg  BID.  Sometimes feels like he is having palpations, sometimes intermittent brief chest pains.  138/84s are a typical reading he gets.  Just got back from [place], but up many hours, so attributes current BP to this.   sometime if resting or meditating, sees 120/80s.  Not eating meat, but does get processed foods like zebra cakes, chips, snacks .  generally sees 50-60 bpm pulse.  Is a truck driver, [place].   Asthma- no recent issues.    No recent concerns with mood, mental health issues, doing well.   Still writing, has recently sold some of his work at [employer] center  Past Medical History:  Diagnosis Date  . Allergy   . Anxiety   . Asthma   . Bipolar disorder (HCC)   . Cocaine abuse (HCC)    in past  . Depression   . GERD (gastroesophageal reflux disease)   . Psychiatric diagnosis    Dr. Karmen Bongo, hospitalization [facility] in the past  . Seasonal allergies   . Thrombocytopenia (HCC)   . Wears glasses    Current Outpatient Medications on File Prior to Visit  Medication Sig Dispense Refill  . fluticasone (FLONASE) 50 MCG/ACT nasal spray Place 2 sprays into both nostrils daily. Reported on 01/14/2016    . propranolol (INDERAL) 80 MG tablet Take 1 tablet (80 mg total) by mouth 2 (two) times daily. 180 tablet 3   No current facility-administered medications on file prior to visit.    ROS as in subjective   Objective: BP 140/90   Pulse 75   Temp 98.5 F (36.9 C) (Oral)   Ht 5\' 9"  (1.753 m)   Wt 192 lb 3.2 oz (87.2 kg)   SpO2 98%   BMI 28.38 kg/m   General appearance: alert, no distress, WD/WN,  Oral cavity: MMM, no lesions Neck: supple, no lymphadenopathy, no thyromegaly, no masses Heart: RRR, normal S1, S2, no murmurs Lungs: CTA bilaterally, no wheezes, rhonchi, or rales Pulses: 2+ symmetric, upper and  lower extremities, normal cap refill Ext: no edema     Assessment:  Encounter Diagnoses  Name Primary?  . Essential hypertension, benign Yes  . Generalized anxiety disorder   . Palpitation      Plan: Labs today, referral to cardiology for palpitations, chest pain, and will likely need echo and event monitor.    C/t current medication, work on [employer].  Continue regular exercise   Avriel was seen today for hypertension.  Diagnoses and all orders for this visit:  Essential hypertension, benign -     Comprehensive metabolic panel -     CBC -     Ambulatory referral to Cardiology  Generalized anxiety disorder  Palpitation -     Comprehensive metabolic panel -     CBC -     Ambulatory referral to Cardiology

## 2018-07-23 LAB — CBC
Hematocrit: 45.4 % (ref 37.5–51.0)
Hemoglobin: 15 g/dL (ref 13.0–17.7)
MCH: 28.4 pg (ref 26.6–33.0)
MCHC: 33 g/dL (ref 31.5–35.7)
MCV: 86 fL (ref 79–97)
Platelets: 165 10*3/uL (ref 150–450)
RBC: 5.29 x10E6/uL (ref 4.14–5.80)
RDW: 14 % (ref 11.6–15.4)
WBC: 10 10*3/uL (ref 3.4–10.8)

## 2018-07-23 LAB — COMPREHENSIVE METABOLIC PANEL
A/G RATIO: 2 (ref 1.2–2.2)
ALK PHOS: 65 IU/L (ref 39–117)
ALT: 16 IU/L (ref 0–44)
AST: 20 IU/L (ref 0–40)
Albumin: 4.9 g/dL (ref 3.5–5.5)
BUN/Creatinine Ratio: 4 — ABNORMAL LOW (ref 9–20)
BUN: 5 mg/dL — ABNORMAL LOW (ref 6–20)
Bilirubin Total: 0.8 mg/dL (ref 0.0–1.2)
CO2: 23 mmol/L (ref 20–29)
Calcium: 10.3 mg/dL — ABNORMAL HIGH (ref 8.7–10.2)
Chloride: 104 mmol/L (ref 96–106)
Creatinine, Ser: 1.26 mg/dL (ref 0.76–1.27)
GFR calc Af Amer: 86 mL/min/{1.73_m2} (ref >59)
GFR calc non Af Amer: 74 mL/min/{1.73_m2} (ref >59)
GLOBULIN, TOTAL: 2.5 g/dL (ref 1.5–4.5)
GLUCOSE: 88 mg/dL (ref 65–99)
POTASSIUM: 5.2 mmol/L (ref 3.5–5.2)
SODIUM: 142 mmol/L (ref 134–144)
Total Protein: 7.4 g/dL (ref 6.0–8.5)

## 2018-08-13 ENCOUNTER — Ambulatory Visit: Payer: Self-pay | Admitting: Cardiology

## 2018-08-20 ENCOUNTER — Ambulatory Visit: Payer: BLUE CROSS/BLUE SHIELD | Admitting: Cardiology

## 2018-08-20 ENCOUNTER — Encounter: Payer: Self-pay | Admitting: Cardiology

## 2018-08-20 VITALS — BP 137/87 | HR 75 | Ht 69.0 in | Wt 189.4 lb

## 2018-08-20 DIAGNOSIS — R0602 Shortness of breath: Secondary | ICD-10-CM | POA: Diagnosis not present

## 2018-08-20 DIAGNOSIS — R0789 Other chest pain: Secondary | ICD-10-CM | POA: Diagnosis not present

## 2018-08-20 DIAGNOSIS — R002 Palpitations: Secondary | ICD-10-CM

## 2018-08-20 NOTE — Progress Notes (Signed)
Patient referred by Carlena Hurl, PA-C for palpitation, hypertension  Subjective:   '@Patient'  ID: Kenneth Graham, male    DOB: [birth year], 33 y.o.   MRN: [id number]  Chief Complaint  Patient presents with  . Hypertension    NP Eval  . 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 He has anxiety, depression, bipolar disorder which is followed by a provider- currently not on any medications. He has remote history of coacaine use. He has experienced chest pains-retrosternal, unrelated to exertion, usually occur while driving the truck, and are self limiting. He also has occasional episodes of palpitations. He has noticed shortness of breath, only when he gets anxious. While he is sedentary for the most part while driving truck, he does 100 pushes ups. He is trying to lower his blood pressure by doing more mediation and yoga.   Labs 07/23/2018: Glucose 88. BUN/Cr 5/1.26. eGFR 74. Na/K 142/5.2. Rest of the CMP nrormal. H/H 15/45. MCV 86. Platelets 165.   Past Medical History:  Diagnosis Date  . Allergy   . Anxiety   . Asthma   . Bipolar disorder ([place])   . Cocaine abuse ([place])    in past  . Depression   . GERD (gastroesophageal reflux disease)   . Hypertension   . Palpitation   . Psychiatric diagnosis    Dr. Ned Card, hospitalization [facility] in the past  . Seasonal allergies   . Thrombocytopenia ([place])   . Wears glasses     Past Surgical History:  Procedure Laterality Date  . KNEE SURGERY      Social History   Socioeconomic History  . Marital status: Divorced    Spouse name: Not on file  . Number of children: 1  . Years of education: Not on file  . Highest education level: Not on file  Occupational History  . Not on file  Social Needs  . Financial resource strain: Not on file  . Food insecurity:    Worry: Not on file    Inability: Not on file  . Transportation  needs:    Medical: Not on file    Non-medical: Not on file  Tobacco Use  . Smoking status: Never Smoker  . Smokeless tobacco: Never Used  Substance and Sexual Activity  . Alcohol use: Yes    Comment: 2-3 drinks per week  . Drug use: No    Comment: Former cocaine and crack abuse  . Sexual activity: Not on file  Lifestyle  . Physical activity:    Days per week: Not on file    Minutes per session: Not on file  . Stress: Not on file  Relationships  . Social connections:    Talks on phone: Not on file    Gets together: Not on file    Attends religious service: Not on file    Active member of club or organization: Not on file    Attends meetings of clubs or organizations: Not on file    Relationship status: Not on file  . Intimate partner violence:    Fear of current or ex partner: Not on file    Emotionally abused: Not on file    Physically abused: Not on file    Forced sexual activity: Not on file  Other Topics Concern  . Not on file  Social History Narrative   Single, lives alone, [place], truck  driver for [employer], hauls liquid egg waste.    Current Outpatient Medications on File Prior to Visit  Medication Sig Dispense Refill  . fluticasone (FLONASE) 50 MCG/ACT nasal spray Place 2 sprays into both nostrils daily. Reported on 01/14/2016    . propranolol (INDERAL) 80 MG tablet Take 1 tablet (80 mg total) by mouth 2 (two) times daily. 180 tablet 3   No current facility-administered medications on file prior to visit.     Cardiovascular studies:  EKG 08/20/2018: Sinus  Rhythm  Incomplete RBBB  Review of Systems  Constitution: Negative for decreased appetite, malaise/fatigue, weight gain and weight loss.  HENT: Negative for congestion.   Eyes: Negative for visual disturbance.  Cardiovascular: Positive for chest pain and palpitations (2-3 times a day). Negative for dyspnea on exertion, leg swelling and syncope.  Respiratory: Positive for hemoptysis. Shortness of breath:  When he is stressed.   Endocrine: Negative for cold intolerance.  Hematologic/Lymphatic: Does not bruise/bleed easily.  Skin: Negative for itching and rash.  Musculoskeletal: Negative for myalgias.  Gastrointestinal: Negative for abdominal pain, nausea and vomiting.  Genitourinary: Negative for dysuria.  Neurological: Negative for dizziness and weakness.  Psychiatric/Behavioral: The patient is not nervous/anxious.   All other systems reviewed and are negative.       Vitals:   08/20/18 0905  BP: 137/87  Pulse: 75  SpO2: 100%    Objective:   Physical Exam  Constitutional: He is oriented to person, place, and time. He appears well-developed and well-nourished. No distress.  HENT:  Head: Normocephalic and atraumatic.  Eyes: Pupils are equal, round, and reactive to light. Conjunctivae are normal.  Neck: No JVD present.  Cardiovascular: Normal rate, regular rhythm and intact distal pulses.  No murmur heard. Pulmonary/Chest: Effort normal and breath sounds normal. He has no wheezes. He has no rales.  Abdominal: Soft. Bowel sounds are normal. There is no rebound.  Musculoskeletal:        General: No edema.  Lymphadenopathy:    He has no cervical adenopathy.  Neurological: He is alert and oriented to person, place, and time. No cranial nerve deficit.  Skin: Skin is warm and dry.  Psychiatric: He has a normal mood and affect.  Nursing note and vitals reviewed.         Assessment & Recommendations:   33 y/o [place] male,  1. Palpitations: Appear benign by description. Will obtain echocardiogram to rule put structural abnormality and perform exercise treadmill stress test. Okay to continue propranolol.  2. Shortness of breath: Echocardiogram, as above  3. Atypical chest pain Treadmill stress test  I will see him on as needed basis, unless significant abnormalities found on the above tests.    Thank you for referring the patient to [place]. Please feel free to  contact with any questions.  Nigel Mormon, MD [facility] Pager: [phone] Office: [phone] If no answer Cell [phone]

## 2018-09-10 ENCOUNTER — Other Ambulatory Visit: Payer: BLUE CROSS/BLUE SHIELD

## 2018-11-01 ENCOUNTER — Other Ambulatory Visit: Payer: Self-pay | Admitting: Medical

## 2018-11-01 NOTE — Telephone Encounter (Signed)
Is this ok to refill?  

## 2018-11-12 DIAGNOSIS — S60450A Superficial foreign body of right index finger, initial encounter: Secondary | ICD-10-CM | POA: Diagnosis not present

## 2018-11-12 DIAGNOSIS — M795 Residual foreign body in soft tissue: Secondary | ICD-10-CM | POA: Diagnosis not present

## 2018-11-12 DIAGNOSIS — S60451A Superficial foreign body of left index finger, initial encounter: Secondary | ICD-10-CM | POA: Diagnosis not present

## 2018-11-12 DIAGNOSIS — Y999 Unspecified external cause status: Secondary | ICD-10-CM | POA: Diagnosis not present

## 2018-11-12 DIAGNOSIS — W268XXA Contact with other sharp object(s), not elsewhere classified, initial encounter: Secondary | ICD-10-CM | POA: Diagnosis not present

## 2018-11-12 DIAGNOSIS — Z23 Encounter for immunization: Secondary | ICD-10-CM | POA: Diagnosis not present

## 2019-01-14 ENCOUNTER — Emergency Department (HOSPITAL_COMMUNITY)
Admission: EM | Admit: 2019-01-14 | Discharge: 2019-01-14 | Disposition: A | Discharge status: Home / Self Care | Payer: No Typology Code available for payment source | Attending: Emergency Medicine | Admitting: Emergency Medicine

## 2019-01-14 ENCOUNTER — Other Ambulatory Visit: Payer: Self-pay

## 2019-01-14 ENCOUNTER — Emergency Department (HOSPITAL_COMMUNITY): Payer: No Typology Code available for payment source

## 2019-01-14 DIAGNOSIS — Z880 Allergy status to penicillin: Secondary | ICD-10-CM | POA: Insufficient documentation

## 2019-01-14 DIAGNOSIS — J45909 Unspecified asthma, uncomplicated: Secondary | ICD-10-CM | POA: Diagnosis not present

## 2019-01-14 DIAGNOSIS — S39012A Strain of muscle, fascia and tendon of lower back, initial encounter: Secondary | ICD-10-CM | POA: Diagnosis not present

## 2019-01-14 DIAGNOSIS — Y92411 Interstate highway as the place of occurrence of the external cause: Secondary | ICD-10-CM | POA: Insufficient documentation

## 2019-01-14 DIAGNOSIS — Y9389 Activity, other specified: Secondary | ICD-10-CM | POA: Diagnosis not present

## 2019-01-14 DIAGNOSIS — Z79899 Other long term (current) drug therapy: Secondary | ICD-10-CM | POA: Insufficient documentation

## 2019-01-14 DIAGNOSIS — I10 Essential (primary) hypertension: Secondary | ICD-10-CM | POA: Insufficient documentation

## 2019-01-14 DIAGNOSIS — Y999 Unspecified external cause status: Secondary | ICD-10-CM | POA: Diagnosis not present

## 2019-01-14 DIAGNOSIS — S0990XA Unspecified injury of head, initial encounter: Secondary | ICD-10-CM

## 2019-01-14 DIAGNOSIS — M545 Low back pain: Secondary | ICD-10-CM | POA: Diagnosis not present

## 2019-01-14 MED ORDER — ACETAMINOPHEN 500 MG PO TABS
1000.0000 mg | ORAL_TABLET | Freq: Once | ORAL | Status: AC
  Administered 2019-01-14: 1000 mg via ORAL
  Filled 2019-01-14: qty 2

## 2019-01-14 NOTE — Discharge Instructions (Signed)
Use Tylenol, ice and Motrin as needed for pain. Return for new or worsening symptoms which may include leg weakness, difficulty with urination, persistent vomiting, new injuries not initially appreciated.

## 2019-01-14 NOTE — ED Notes (Signed)
Patient transported to X-ray 
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
## 2019-02-17 DIAGNOSIS — N390 Urinary tract infection, site not specified: Secondary | ICD-10-CM | POA: Diagnosis not present

## 2019-02-18 DIAGNOSIS — N342 Other urethritis: Secondary | ICD-10-CM | POA: Diagnosis not present

## 2019-02-18 DIAGNOSIS — R300 Dysuria: Secondary | ICD-10-CM | POA: Diagnosis not present

## 2019-02-19 DIAGNOSIS — N410 Acute prostatitis: Secondary | ICD-10-CM | POA: Diagnosis not present

## 2019-02-19 DIAGNOSIS — R300 Dysuria: Secondary | ICD-10-CM | POA: Diagnosis not present

## 2019-02-20 DIAGNOSIS — R300 Dysuria: Secondary | ICD-10-CM | POA: Diagnosis not present

## 2019-03-04 ENCOUNTER — Encounter: Payer: Self-pay | Admitting: Medical

## 2019-03-04 ENCOUNTER — Other Ambulatory Visit: Payer: Self-pay

## 2019-03-04 ENCOUNTER — Ambulatory Visit: Payer: BC Managed Care – PPO | Admitting: Medical

## 2019-03-04 VITALS — BP 110/80 | HR 64 | Ht 69.0 in | Wt 180.6 lb

## 2019-03-04 DIAGNOSIS — N401 Enlarged prostate with lower urinary tract symptoms: Secondary | ICD-10-CM

## 2019-03-04 DIAGNOSIS — N419 Inflammatory disease of prostate, unspecified: Secondary | ICD-10-CM | POA: Diagnosis not present

## 2019-03-04 MED ORDER — TAMSULOSIN HCL 0.4 MG PO CAPS: 0.4000 mg | ORAL_CAPSULE | Freq: Every day | ORAL | 3 refills | Status: DC

## 2019-03-04 MED ORDER — CIPROFLOXACIN HCL 500 MG PO TABS: 500.0000 mg | ORAL_TABLET | Freq: Two times a day (BID) | ORAL | 0 refills | Status: DC

## 2019-03-04 NOTE — Progress Notes (Signed)
Subjective: Chief Complaint  Patient presents with  . Follow-up    enlarged prostate    Here for ED f/u.  Was seen a few times recently in the ED, 3 times in 72 hours, in [place], [place].   He notes hx/o enlarged prostate, and recently was having prostatitis and a lot of pain, hard time urinating, worried he had STD.  Had labs.   Was initially put on different antibiotic, but by 3rd ED visit was given more pain medication and Cipro.  Symptoms finally resolved.  He was seen at [facility], and had separate visit at Urgent care, had "full STD panel" that was negative.  Cipro seemed to resolve the symptoms.  Finishes Cipro tomorrow. No other aggravating or relieving factors. No other complaint.   Past Medical History:  Diagnosis Date  . Allergy   . Anxiety   . Asthma   . Bipolar disorder ([place])   . Cocaine abuse ([place])    in past  . Depression   . GERD (gastroesophageal reflux disease)   . Hypertension   . Palpitation   . Psychiatric diagnosis    Dr. Ned Card, hospitalization [facility] in the past  . Seasonal allergies   . Thrombocytopenia ([place])   . Wears glasses    Current Outpatient Medications on File Prior to Visit  Medication Sig Dispense Refill  . propranolol (INDERAL) 80 MG tablet Take 1 tablet by mouth twice daily 180 tablet 0  . fluticasone (FLONASE) 50 MCG/ACT nasal spray Place 2 sprays into both nostrils daily. Reported on 01/14/2016     No current facility-administered medications on file prior to visit.    ROS as in subjective   Objective: BP 110/80   Pulse 64   Ht 5\' 9"  (1.753 m)   Wt 180 lb 9.6 oz (81.9 kg)   SpO2 97%   BMI 26.67 kg/m   General appearance: alert, no distress, WD/WN,  Neck: supple, no lymphadenopathy, no thyromegaly, no masses Heart: RRR, normal S1, S2, no murmurs Lungs: CTA bilaterally, no wheezes, rhonchi, or rales Abdomen: +bs, soft, non tender, non distended, no masses, no hepatomegaly, no  splenomegaly Pulses: 2+ symmetric, upper and lower extremities, normal cap refill Ext: no edema GU/rectal - deferred today  Assessment: Encounter Diagnoses  Name Primary?  . Prostatitis, unspecified prostatitis type Yes  . Benign prostatic hyperplasia with lower urinary tract symptoms, symptom details unspecified      Plan: I reviewed his recent notes in the care everywhere for eval for prostatitis and BPH.  He will begin Flomax which he has taken in the past.  He would finish out current Cipro but I gave him around another round of Cipro in case his symptoms flare back up for prostatitis.  He will return in 1 month for PSA and recheck.  He may consider beginning saw palmetto over-the-counter  Jarrel was seen today for follow-up.  Diagnoses and all orders for this visit:  Prostatitis, unspecified prostatitis type  Benign prostatic hyperplasia with lower urinary tract symptoms, symptom details unspecified  Other orders -     tamsulosin (FLOMAX) 0.4 MG CAPS capsule; Take 1 capsule (0.4 mg total) by mouth daily. -     ciprofloxacin (CIPRO) 500 MG tablet; Take 1 tablet (500 mg total) by mouth 2 (two) times daily.

## 2019-03-04 NOTE — Patient Instructions (Signed)

## 2019-03-16 ENCOUNTER — Other Ambulatory Visit: Payer: Self-pay | Admitting: Medical

## 2019-03-16 ENCOUNTER — Telehealth: Payer: Self-pay | Admitting: Internal Medicine

## 2019-03-16 DIAGNOSIS — N410 Acute prostatitis: Secondary | ICD-10-CM

## 2019-03-16 NOTE — Telephone Encounter (Signed)
Pt is scheduled for psa recheck but no order, please put in future order

## 2019-03-25 ENCOUNTER — Other Ambulatory Visit: Payer: BC Managed Care – PPO

## 2019-03-25 ENCOUNTER — Other Ambulatory Visit: Payer: Self-pay

## 2019-03-25 DIAGNOSIS — N410 Acute prostatitis: Secondary | ICD-10-CM | POA: Diagnosis not present

## 2019-03-26 LAB — PSA: Prostate Specific Ag, Serum: 2.5 ng/mL (ref 0.0–4.0)

## 2019-03-27 ENCOUNTER — Other Ambulatory Visit: Payer: Self-pay | Admitting: Medical

## 2019-05-20 ENCOUNTER — Other Ambulatory Visit: Payer: Self-pay

## 2019-05-20 ENCOUNTER — Ambulatory Visit: Payer: BC Managed Care – PPO | Admitting: Medical

## 2019-05-20 ENCOUNTER — Encounter: Payer: Self-pay | Admitting: Medical

## 2019-05-20 VITALS — BP 132/86 | HR 66 | Temp 98.9°F | Ht 69.0 in | Wt 188.8 lb

## 2019-05-20 DIAGNOSIS — F1911 Other psychoactive substance abuse, in remission: Secondary | ICD-10-CM

## 2019-05-20 DIAGNOSIS — F325 Major depressive disorder, single episode, in full remission: Secondary | ICD-10-CM

## 2019-05-20 DIAGNOSIS — F411 Generalized anxiety disorder: Secondary | ICD-10-CM | POA: Diagnosis not present

## 2019-05-20 DIAGNOSIS — Z840 Family history of diseases of the skin and subcutaneous tissue: Secondary | ICD-10-CM | POA: Diagnosis not present

## 2019-05-20 DIAGNOSIS — Z8659 Personal history of other mental and behavioral disorders: Secondary | ICD-10-CM

## 2019-05-20 DIAGNOSIS — Z3009 Encounter for other general counseling and advice on contraception: Secondary | ICD-10-CM

## 2019-05-20 DIAGNOSIS — R002 Palpitations: Secondary | ICD-10-CM

## 2019-05-20 DIAGNOSIS — Z13220 Encounter for screening for lipoid disorders: Secondary | ICD-10-CM

## 2019-05-20 DIAGNOSIS — Z0000 Encounter for general adult medical examination without abnormal findings: Secondary | ICD-10-CM

## 2019-05-20 DIAGNOSIS — Z113 Encounter for screening for infections with a predominantly sexual mode of transmission: Secondary | ICD-10-CM | POA: Diagnosis not present

## 2019-05-20 DIAGNOSIS — I10 Essential (primary) hypertension: Secondary | ICD-10-CM

## 2019-05-20 LAB — POCT URINALYSIS DIP (PROADVANTAGE DEVICE)
Bilirubin, UA: NEGATIVE
Blood, UA: NEGATIVE
Glucose, UA: NEGATIVE mg/dL
Ketones, POC UA: NEGATIVE mg/dL
Leukocytes, UA: NEGATIVE
Nitrite, UA: NEGATIVE
Protein Ur, POC: NEGATIVE mg/dL
Specific Gravity, Urine: 1.01
Urobilinogen, Ur: NEGATIVE
pH, UA: 6 (ref 5.0–8.0)

## 2019-05-20 NOTE — Progress Notes (Signed)
Subjective: Chief Complaint  Patient presents with  . Consult    discuss vasectomy-  . Annual Exam   Medical team: Sees eye doctor and dentist Cardiology, Dr. Graciella Freer, Kermit Balo, PA-C here for primary care No counselor or mental health provider, last provider visit 2017   Concerns: Hx/o depression, bipolar.  Mood is ok.  No recent issues.    Here for consult for vasectomy.  Been thinking about for this but years but keeps putting this off.  Sees son on weekend.   Has 2 children, 2 son, and they both live with their mom.  Only gets to see one of them on the weekends.  Says the mother is difficult, "is a complex situation."   Has girlfriend, and she has a child.  They are living together and he told her he doesn't want any more children.    Declines flu shot   Reviewed their medical, surgical, family, social, medication, and allergy history and updated chart as appropriate.  Past Medical History:  Diagnosis Date  . Allergy   . Anxiety   . Asthma    childhood  . Bipolar disorder (HCC)   . Cocaine abuse (HCC)    in past  . Depression   . GERD (gastroesophageal reflux disease)   . Hypertension   . Palpitation   . Psychiatric diagnosis    Dr. Karmen Bongo, hospitalization [facility] in the past  . Seasonal allergies   . Thrombocytopenia (HCC)    remote past?  . Wears glasses     Past Surgical History:  Procedure Laterality Date  . KNEE SURGERY  2000   arthoscopic  . MASTOIDECTOMY     cellulitis, age 61yo; left    Social History   Socioeconomic History  . Marital status: Divorced    Spouse name: Not on file  . Number of children: 1  . Years of education: Not on file  . Highest education level: Not on file  Occupational History  . Not on file  Social Needs  . Financial resource strain: Not on file  . Food insecurity    Worry: Not on file    Inability: Not on file  . Transportation needs    Medical: Not on file    Non-medical: Not  on file  Tobacco Use  . Smoking status: Never Smoker  . Smokeless tobacco: Never Used  Substance and Sexual Activity  . Alcohol use: Yes    Alcohol/week: 4.0 standard drinks    Types: 4 Glasses of wine per week  . Drug use: No    Comment: Former cocaine and crack abuse, last 2011  . Sexual activity: Not on file  Lifestyle  . Physical activity    Days per week: Not on file    Minutes per session: Not on file  . Stress: Not on file  Relationships  . Social Musician on phone: Not on file    Gets together: Not on file    Attends religious service: Not on file    Active member of club or organization: Not on file    Attends meetings of clubs or organizations: Not on file    Relationship status: Not on file  . Intimate partner violence    Fear of current or ex partner: Not on file    Emotionally abused: Not on file    Physically abused: Not on file    Forced sexual activity: Not on file  Other Topics Concern  .  Not on file  Social History Narrative   Single, lives with girlfriend and her child.  Has 2 sons that live with their mother.  He sees one of his sons on weekend.   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, [employer] liquid egg waste.  Walks some for exercise.  05/2019    Family History  Problem Relation Age of Onset  . Depression Mother   . Bipolar disorder Mother   . Hyperlipidemia Father   . Cancer Paternal Grandfather        lung  . Cancer Cousin   . Cancer Cousin   . Heart disease Neg Hx   . Stroke Neg Hx   . Diabetes Neg Hx      Current Outpatient Medications:  .  propranolol (INDERAL) 80 MG tablet, Take 1 tablet by mouth twice daily, Disp: 180 tablet, Rfl: 0 .  ciprofloxacin (CIPRO) 500 MG tablet, Take 1 tablet (500 mg total) by mouth 2 (two) times daily. (Patient not taking: Reported on 05/20/2019), Disp: 28 tablet, Rfl: 0 .  fluticasone (FLONASE) 50 MCG/ACT nasal spray, Place 2 sprays into both nostrils daily. Reported on 01/14/2016, Disp: , Rfl:  .   tamsulosin (FLOMAX) 0.4 MG CAPS capsule, Take 1 capsule (0.4 mg total) by mouth daily. (Patient not taking: Reported on 05/20/2019), Disp: 30 capsule, Rfl: 3  Allergies  Allergen Reactions  . Haldol [Haloperidol Decanoate] Anaphylaxis  . Lexapro [Escitalopram Oxalate]     Tremors?  . Penicillins Other (See Comments)    unknown  . Rocephin [Ceftriaxone] Hives       Review of Systems Constitutional: -fever, -chills, -sweats, -unexpected weight change, -decreased appetite, -fatigue Allergy: -sneezing, -itching, -congestion Dermatology: -changing moles, --rash, -lumps ENT: -runny nose, -ear pain, -sore throat, -hoarseness, -sinus pain, -teeth pain, - ringing in ears, -hearing loss, -nosebleeds Cardiology: -chest pain, -palpitations, -swelling, -difficulty breathing when lying flat, -waking up short of breath Respiratory: -cough, -shortness of breath, -difficulty breathing with exercise or exertion, -wheezing, -coughing up blood Gastroenterology: -abdominal pain, -nausea, -vomiting, -diarrhea, -constipation, -blood in stool, -changes in bowel movement, -difficulty swallowing or eating Hematology: -bleeding, -bruising  Musculoskeletal: -joint aches, -muscle aches, -joint swelling, -back pain, -neck pain, -cramping, -changes in gait Ophthalmology: denies vision changes, eye redness, itching, discharge Urology: -burning with urination, -difficulty urinating, -blood in urine, -urinary frequency, -urgency, -incontinence Neurology: -headache, -weakness, -tingling, -numbness, -memory loss, -falls, -dizziness Psychology: -depressed mood, -agitation, -sleep problems Male GU: no testicular mass, pain, no lymph nodes swollen, no swelling, no rash.     Objective:  BP 132/86   Pulse 66   Temp 98.9 F (37.2 C)   Ht 5\' 9"  (1.753 m)   Wt 188 lb 12.8 oz (85.6 kg)   SpO2 98%   BMI 27.88 kg/m   General appearance: alert, no distress, WD/WN, African American male Skin: scattered macules , no  worrisome lesions, tattoo mary on left abdomen, tattoo chinese writing left upper arm, tattoo right forearm anteriorly HEENT: normocephalic, conjunctiva/corneas normal, sclerae anicteric, PERRLA, EOMi, nares patent, no discharge or erythema, pharynx normal Oral cavity: MMM, tongue normal, teeth normal Neck: supple, no lymphadenopathy, no thyromegaly, no masses, normal ROM, no bruits Chest: increased breast tissue c/w gynecomastia, otherwise non tender, normal shape and expansion Heart: RRR, normal S1, S2, no murmurs Lungs: CTA bilaterally, no wheezes, rhonchi, or rales Abdomen: +bs, soft, non tender, non distended, no masses, no hepatomegaly, no splenomegaly, no bruits Back: non tender, normal ROM, no scoliosis Musculoskeletal: upper extremities non tender, no obvious deformity,  normal ROM throughout, lower extremities non tender, no obvious deformity, normal ROM throughout Extremities: no edema, no cyanosis, no clubbing Pulses: 2+ symmetric, upper and lower extremities, normal cap refill Neurological: alert, oriented x 3, CN2-12 intact, strength normal upper extremities and lower extremities, sensation normal throughout, DTRs 2+ throughout, no cerebellar signs, gait normal Psychiatric: normal affect, behavior normal, pleasant  GU: normal male external genitalia,circumcised, nontender, no masses, no hernia, no lymphadenopathy Rectal: deferred  Assessment and Plan :   Encounter Diagnoses  Name Primary?  . Encounter for health maintenance examination in adult Yes  . Essential hypertension, benign   . Family history of lupus erythematosus   . Generalized anxiety disorder   . Screening for lipid disorders   . Palpitation   . Screen for STD (sexually transmitted disease)   . Visit for vasectomy evaluation   . History of bipolar disorder   . Depression, major, in remission ([place])   . History of substance abuse [facility])     Physical exam - discussed and counseled on healthy lifestyle, diet,  exercise, preventative care, vaccinations, sick and well care, proper use of emergency dept and after hours care, and addressed their concerns.    Health screening: See your eye doctor yearly for routine vision care. See your dentist yearly for routine dental care including hygiene visits twice yearly.  Discussed STD testing, discussed prevention, condom use, means of transmission  Cancer screening Advised monthly self testicular exam  Vaccinations: Advised yearly influenza vaccine Patient declines influenza vaccine  Up to date on tdap vaccine   Separate significant chronic issues discussed: Hypertension - I reviewed his 08/2018 cardiology visit notes.  At that time was advised to have echo and stress test based on palpitations although they felt his palpitations were benign.   He was continued on propranolol.     Referral to urology for vasectomy  Routine labs today  Hx/o depression, bipolar - he seems to be doing well in recent years.  Relationship seems to be stable.     Rodrigus was seen today for consult and annual exam.  Diagnoses and all orders for this visit:  Encounter for health maintenance examination in adult -     Cancel: Spirometry with graph -     Comprehensive metabolic panel -     CBC with Differential -     Lipid Panel -     TSH regular -     HIV regular -     RPR regular -     Ambulatory referral to Urology -     Hepatitis C antibody -     Hepatitis B surface antigen  Essential hypertension, benign  Family history of lupus erythematosus  Generalized anxiety disorder  Screening for lipid disorders  Palpitation  Screen for STD (sexually transmitted disease) -     HIV regular -     RPR regular -     Hepatitis C antibody -     Hepatitis B surface antigen  Visit for vasectomy evaluation -     Ambulatory referral to Urology  History of bipolar disorder  Depression, major, in remission ([place])  History of substance abuse ([place])   Follow-up  pending labs, yearly for physical

## 2019-05-20 NOTE — Addendum Note (Signed)
Addended by: Edgar Frisk on: 05/20/2019 10:44 AM   Modules accepted: Orders

## 2019-05-20 NOTE — Patient Instructions (Signed)
Thanks for trusting [place] with your health care and for coming in for a physical today.  Below are some general recommendations I have for you:  Yearly screenings See your eye doctor yearly for routine vision care. See your dentist yearly for routine dental care including hygiene visits twice yearly. See me here yearly for a routine physical and preventative care visit   Specific Concerns today:  . We will refer you to Urology for consult for vasectomy . You saw cardiology early in the year.   They scheduled you for echocardiogram and stress test.  I don't think you went for the test.  I recommend you follow up with them. . I recommend you exercise regularly, eat a healthy low fat diet, avoid junk food and fast food . Monitor your blood pressure 2-3 days per week with goal < 130/80.  . We will call with lab results    Please follow up yearly for a physical.   Preventative Care for Adults - Male      MAINTAIN REGULAR HEALTH EXAMS:  A routine yearly physical is a good way to check in with your primary care provider about your health and preventive screening. It is also an opportunity to share updates about your health and any concerns you have, and receive a thorough all-over exam.   Most health insurance companies pay for at least some preventative services.  Check with your health plan for specific coverages.  WHAT PREVENTATIVE SERVICES DO MEN NEED?  Adult men should have their weight and blood pressure checked regularly.   Men age 44 and older should have their cholesterol levels checked regularly.  Beginning at age 29 and continuing to age 24, men should be screened for colorectal cancer.  Certain people may need continued testing until age 72.  Updating vaccinations is part of preventative care.  Vaccinations help protect against diseases such as the flu.  Osteoporosis is a disease in which the bones lose minerals and strength as we age. Men ages 70 and over should discuss  this with their caregivers  Lab tests are generally done as part of preventative care to screen for anemia and blood disorders, to screen for problems with the kidneys and liver, to screen for bladder problems, to check blood sugar, and to check your cholesterol level.  Preventative services generally include counseling about diet, exercise, avoiding tobacco, drugs, excessive alcohol consumption, and sexually transmitted infections.    GENERAL RECOMMENDATIONS FOR GOOD HEALTH:  Healthy diet:  Eat a variety of foods, including fruit, vegetables, animal or vegetable protein, such as meat, fish, chicken, and eggs, or beans, lentils, tofu, and grains, such as rice.  Drink plenty of water daily.  Decrease saturated fat in the diet, avoid lots of red meat, processed foods, sweets, fast foods, and fried foods.  Exercise:  Aerobic exercise helps maintain good heart health. At least 30-40 minutes of moderate-intensity exercise is recommended. For example, a brisk walk that increases your heart rate and breathing. This should be done on most days of the week.   Find a type of exercise or a variety of exercises that you enjoy so that it becomes a part of your daily life.  Examples are running, walking, swimming, water aerobics, and biking.  For motivation and support, explore group exercise such as aerobic class, spin class, Zumba, Yoga,or  martial arts, etc.    Set exercise goals for yourself, such as a certain weight goal, walk or run in a race such as a  5k walk/run.  Speak to your primary care provider about exercise goals.  Disease prevention:  If you smoke or chew tobacco, find out from your caregiver how to quit. It can literally save your life, no matter how long you have been a tobacco user. If you do not use tobacco, never begin.   Maintain a healthy diet and normal weight. Increased weight leads to problems with blood pressure and diabetes.   The Body Mass Index or BMI is a way of  measuring how much of your body is fat. Having a BMI above 27 increases the risk of heart disease, diabetes, hypertension, stroke and other problems related to obesity. Your caregiver can help determine your BMI and based on it develop an exercise and dietary program to help you achieve or maintain this important measurement at a healthful level.  High blood pressure causes heart and blood vessel problems.  Persistent high blood pressure should be treated with medicine if weight loss and exercise do not work.   Fat and cholesterol leaves deposits in your arteries that can block them. This causes heart disease and vessel disease elsewhere in your body.  If your cholesterol is found to be high, or if you have heart disease or certain other medical conditions, then you may need to have your cholesterol monitored frequently and be treated with medication.   Ask if you should have a cardiac stress test if your history suggests this. A stress test is a test done on a treadmill that looks for heart disease. This test can find disease prior to there being a problem.  Osteoporosis is a disease in which the bones lose minerals and strength as we age. This can result in serious bone fractures. Risk of osteoporosis can be identified using a bone density scan. Men ages 45 and over should discuss this with their caregivers. Ask your caregiver whether you should be taking a calcium supplement and Vitamin D, to reduce the rate of osteoporosis.   Avoid drinking alcohol in excess (more than two drinks per day).  Avoid use of street drugs. Do not share needles with anyone. Ask for professional help if you need assistance or instructions on stopping the use of alcohol, cigarettes, and/or drugs.  Brush your teeth twice a day with fluoride toothpaste, and floss once a day. Good oral hygiene prevents tooth decay and gum disease. The problems can be painful, unattractive, and can cause other health problems. Visit your dentist  for a routine oral and dental check up and preventive care every 6-12 months.   Look at your skin regularly.  Use a mirror to look at your back. Notify your caregivers of changes in moles, especially if there are changes in shapes, colors, a size larger than a pencil eraser, an irregular border, or development of new moles.  Safety:  Use seatbelts 100% of the time, whether driving or as a passenger.  Use safety devices such as hearing protection if you work in environments with loud noise or significant background noise.  Use safety glasses when doing any work that could send debris in to the eyes.  Use a helmet if you ride a bike or motorcycle.  Use appropriate safety gear for contact sports.  Talk to your caregiver about gun safety.  Use sunscreen with a SPF (or skin protection factor) of 15 or greater.  Lighter skinned people are at a greater risk of skin cancer. Don't forget to also wear sunglasses in order to protect your eyes from  too much damaging sunlight. Damaging sunlight can accelerate cataract formation.   Practice safe sex. Use condoms. Condoms are used for birth control and to help reduce the spread of sexually transmitted infections (or STIs).  Some of the STIs are gonorrhea (the clap), chlamydia, syphilis, trichomonas, herpes, HPV (human papilloma virus) and HIV (human immunodeficiency virus) which causes AIDS. The herpes, HIV and HPV are viral illnesses that have no cure. These can result in disability, cancer and death.   Keep carbon monoxide and smoke detectors in your home functioning at all times. Change the batteries every 6 months or use a model that plugs into the wall.   Vaccinations:  Stay up to date with your tetanus shots and other required immunizations. You should have a booster for tetanus every 10 years. Be sure to get your flu shot every year, since 5%-20% of the [place] population comes down with the flu. The flu vaccine changes each year, so being vaccinated once is  not enough. Get your shot in the fall, before the flu season peaks.   Other vaccines to consider:  Human Papilloma Virus or HPV causes cancer of the cervix, and other infections that can be transmitted from person to person. There is a vaccine for HPV, and males should get immunized between the ages of 28 and 48. It requires a series of 3 shots.   Pneumococcal vaccine to protect against certain types of pneumonia.  This is normally recommended for adults age 27 or older.  However, adults younger than 34 years old with certain underlying conditions such as diabetes, heart or lung disease should also receive the vaccine.  Shingles vaccine to protect against Varicella Zoster if you are older than age 84, or younger than 34 years old with certain underlying illness.  If you have not had the Shingrix vaccine, please call your insurer to inquire about coverage for the Shingrix vaccine given in 2 doses.   Some insurers cover this vaccine after age 4, some cover this after age 37.  If your insurer covers this, then call to schedule appointment to have this vaccine here  Hepatitis A vaccine to protect against a form of infection of the liver by a virus acquired from food.  Hepatitis B vaccine to protect against a form of infection of the liver by a virus acquired from blood or body fluids, particularly if you work in health care.  If you plan to travel internationally, check with your local health department for specific vaccination recommendations.   What should I know about cancer screening? Many types of cancers can be detected early and may often be prevented. Lung Cancer  You should be screened every year for lung cancer if: ? You are a current smoker who has smoked for at least 30 years. ? You are a former smoker who has quit within the past 15 years.  Talk to your health care provider about your screening options, when you should start screening, and how often you should be  screened.  Colorectal Cancer  Routine colorectal cancer screening usually begins at 34 years of age and should be repeated every 5-10 years until you are 34 years old. You may need to be screened more often if early forms of precancerous polyps or small growths are found. Your health care provider may recommend screening at an earlier age if you have risk factors for colon cancer.  Your health care provider may recommend using home test kits to check for hidden blood in the  stool.  A small camera at the end of a tube can be used to examine your colon (sigmoidoscopy or colonoscopy). This checks for the earliest forms of colorectal cancer.  Prostate and Testicular Cancer  Depending on your age and overall health, your health care provider may do certain tests to screen for prostate and testicular cancer.  Talk to your health care provider about any symptoms or concerns you have about testicular or prostate cancer.  Skin Cancer  Check your skin from head to toe regularly.  Tell your health care provider about any new moles or changes in moles, especially if: ? There is a change in a mole's size, shape, or color. ? You have a mole that is larger than a pencil eraser.  Always use sunscreen. Apply sunscreen liberally and repeat throughout the day.  Protect yourself by wearing long sleeves, pants, a wide-brimmed hat, and sunglasses when outside.

## 2019-05-21 LAB — CBC WITH DIFFERENTIAL/PLATELET
Basophils Absolute: 0.1 10*3/uL (ref 0.0–0.2)
Basos: 1 %
EOS (ABSOLUTE): 0.1 10*3/uL (ref 0.0–0.4)
Eos: 2 %
Hematocrit: 43.4 % (ref 37.5–51.0)
Hemoglobin: 14.6 g/dL (ref 13.0–17.7)
Immature Grans (Abs): 0 10*3/uL (ref 0.0–0.1)
Immature Granulocytes: 0 %
Lymphocytes Absolute: 1.3 10*3/uL (ref 0.7–3.1)
Lymphs: 22 %
MCH: 28.5 pg (ref 26.6–33.0)
MCHC: 33.6 g/dL (ref 31.5–35.7)
MCV: 85 fL (ref 79–97)
Monocytes Absolute: 0.5 10*3/uL (ref 0.1–0.9)
Monocytes: 9 %
Neutrophils Absolute: 4 10*3/uL (ref 1.4–7.0)
Neutrophils: 66 %
Platelets: 145 10*3/uL — ABNORMAL LOW (ref 150–450)
RBC: 5.13 x10E6/uL (ref 4.14–5.80)
RDW: 13.1 % (ref 11.6–15.4)
WBC: 6 10*3/uL (ref 3.4–10.8)

## 2019-05-21 LAB — COMPREHENSIVE METABOLIC PANEL
ALT: 15 IU/L (ref 0–44)
AST: 22 IU/L (ref 0–40)
Albumin/Globulin Ratio: 1.8 (ref 1.2–2.2)
Albumin: 4.5 g/dL (ref 4.0–5.0)
Alkaline Phosphatase: 64 IU/L (ref 39–117)
BUN/Creatinine Ratio: 9 (ref 9–20)
BUN: 11 mg/dL (ref 6–20)
Bilirubin Total: 0.6 mg/dL (ref 0.0–1.2)
CO2: 22 mmol/L (ref 20–29)
Calcium: 9.8 mg/dL (ref 8.7–10.2)
Chloride: 107 mmol/L — ABNORMAL HIGH (ref 96–106)
Creatinine, Ser: 1.19 mg/dL (ref 0.76–1.27)
GFR calc Af Amer: 92 mL/min/{1.73_m2} (ref >59)
GFR calc non Af Amer: 80 mL/min/{1.73_m2} (ref >59)
Globulin, Total: 2.5 g/dL (ref 1.5–4.5)
Glucose: 93 mg/dL (ref 65–99)
Potassium: 4.7 mmol/L (ref 3.5–5.2)
Sodium: 142 mmol/L (ref 134–144)
Total Protein: 7 g/dL (ref 6.0–8.5)

## 2019-05-21 LAB — LIPID PANEL
Chol/HDL Ratio: 4.7 ratio (ref 0.0–5.0)
Cholesterol, Total: 169 mg/dL (ref 100–199)
HDL: 36 mg/dL — ABNORMAL LOW (ref >39)
LDL Chol Calc (NIH): 122 mg/dL — ABNORMAL HIGH (ref 0–99)
Triglycerides: 57 mg/dL (ref 0–149)
VLDL Cholesterol Cal: 11 mg/dL (ref 5–40)

## 2019-05-21 LAB — HEPATITIS C ANTIBODY: Hep C Virus Ab: <0.1 s/co ratio (ref 0.0–0.9)

## 2019-05-21 LAB — RPR: RPR Ser Ql: NONREACTIVE

## 2019-05-21 LAB — HIV ANTIBODY (ROUTINE TESTING W REFLEX): HIV Screen 4th Generation wRfx: NONREACTIVE

## 2019-05-21 LAB — TSH: TSH: 1.34 u[IU]/mL (ref 0.450–4.500)

## 2019-05-21 LAB — HEPATITIS B SURFACE ANTIGEN: Hepatitis B Surface Ag: NEGATIVE

## 2019-07-15 DIAGNOSIS — Z3009 Encounter for other general counseling and advice on contraception: Secondary | ICD-10-CM | POA: Diagnosis not present

## 2019-07-29 ENCOUNTER — Other Ambulatory Visit: Payer: Self-pay | Admitting: Medical

## 2019-08-12 DIAGNOSIS — Z302 Encounter for sterilization: Secondary | ICD-10-CM | POA: Diagnosis not present

## 2019-11-02 ENCOUNTER — Telehealth: Payer: Self-pay

## 2019-11-02 ENCOUNTER — Other Ambulatory Visit: Payer: Self-pay

## 2019-11-02 MED ORDER — PROPRANOLOL HCL 80 MG PO TABS: 80.0000 mg | ORAL_TABLET | Freq: Two times a day (BID) | ORAL | 0 refills | Status: DC

## 2019-11-02 NOTE — Telephone Encounter (Signed)
Received a fax from [facility] pharmacy for a refill on the pts. Propranolol pt. Last apt was 05/20/19.

## 2019-11-02 NOTE — Telephone Encounter (Signed)
Done

## 2020-03-20 ENCOUNTER — Other Ambulatory Visit: Payer: Self-pay | Admitting: Medical

## 2020-03-21 NOTE — Telephone Encounter (Signed)
Is this refill appropriate?  

## 2020-03-21 NOTE — Telephone Encounter (Signed)
Schedule November fasting physical done refill enough to get him through to that appointment

## 2020-03-22 ENCOUNTER — Other Ambulatory Visit: Payer: Self-pay | Admitting: Medical

## 2020-03-22 ENCOUNTER — Telehealth: Payer: Self-pay

## 2020-03-22 MED ORDER — PROPRANOLOL HCL 80 MG PO TABS: 80.0000 mg | ORAL_TABLET | Freq: Two times a day (BID) | ORAL | 0 refills | Status: DC

## 2020-03-22 NOTE — Telephone Encounter (Signed)
Received fax from [facility] pharmacy for a refill on the pts. Propranolol pt. Last apt was 05/20/19.

## 2020-03-27 NOTE — Telephone Encounter (Signed)
Patient has appointment scheduled.

## 2020-05-14 ENCOUNTER — Other Ambulatory Visit: Payer: Self-pay | Admitting: Medical

## 2020-05-25 ENCOUNTER — Encounter: Payer: Self-pay | Admitting: Medical

## 2020-05-25 ENCOUNTER — Ambulatory Visit: Payer: BC Managed Care – PPO | Admitting: Medical

## 2020-05-25 ENCOUNTER — Other Ambulatory Visit: Payer: Self-pay

## 2020-05-25 VITALS — BP 132/88 | HR 64 | Temp 98.6°F | Ht 69.5 in | Wt 201.8 lb

## 2020-05-25 DIAGNOSIS — F411 Generalized anxiety disorder: Secondary | ICD-10-CM

## 2020-05-25 DIAGNOSIS — Z0000 Encounter for general adult medical examination without abnormal findings: Secondary | ICD-10-CM

## 2020-05-25 DIAGNOSIS — Z13220 Encounter for screening for lipoid disorders: Secondary | ICD-10-CM | POA: Diagnosis not present

## 2020-05-25 DIAGNOSIS — Z7185 Encounter for immunization safety counseling: Secondary | ICD-10-CM | POA: Insufficient documentation

## 2020-05-25 DIAGNOSIS — I10 Essential (primary) hypertension: Secondary | ICD-10-CM

## 2020-05-25 DIAGNOSIS — Z840 Family history of diseases of the skin and subcutaneous tissue: Secondary | ICD-10-CM | POA: Diagnosis not present

## 2020-05-25 DIAGNOSIS — F325 Major depressive disorder, single episode, in full remission: Secondary | ICD-10-CM

## 2020-05-25 MED ORDER — AMLODIPINE-OLMESARTAN 5-20 MG PO TABS: 1.0000 | ORAL_TABLET | Freq: Every day | ORAL | 2 refills | Status: DC

## 2020-05-25 NOTE — Progress Notes (Signed)
Subjective:   HPI  Kenneth Graham is a 34 y.o. male who presents for Chief Complaint  Patient presents with  . Annual Exam    cpe BP has been high at home    Patient Care Team: Amaka Gluth, Kermit Balo, PA-C as PCP - General (Family Medicine) Sees dentist Sees eye doctor  Concerns: No particular issues.  HTN - compliant with medication.  Usually BP is good at home, in the 120/80s.   Has a BP device with phone app.  Sometimes is seeing higher numbers.   Sometimes sees 150/100.  Mood is fine.  No recent problems.  No recent depressed moods.  No mood swings.  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    Reviewed their medical, surgical, family, social, medication, and allergy history and updated chart as appropriate.  Past Medical History:  Diagnosis Date  . Allergy   . Anxiety   . Asthma    childhood  . Bipolar disorder (HCC)   . Cocaine abuse (HCC)    in past  . Depression   . GERD (gastroesophageal reflux disease)   . Hypertension   . Palpitation   . Psychiatric diagnosis    Dr. Karmen Bongo, hospitalization [facility] in the past  . Seasonal allergies   . Thrombocytopenia (HCC)    remote past?  . Wears glasses     Past Surgical History:  Procedure Laterality Date  . KNEE SURGERY  2000   arthoscopic  . MASTOIDECTOMY     cellulitis, age 42yo; left    Family History  Problem Relation Age of Onset  . Depression Mother   . Bipolar disorder Mother   . Hyperlipidemia Father   . Cancer Paternal Grandfather        lung  . Cancer Cousin   . Cancer Cousin   . Heart disease Neg Hx   . Stroke Neg Hx   . Diabetes Neg Hx      Current Outpatient Medications:  .  fluticasone (FLONASE) 50 MCG/ACT nasal spray, Place 2 sprays into both nostrils daily. Reported on 01/14/2016, Disp: , Rfl:  .  amLODipine-olmesartan (AZOR) 5-20 MG tablet, Take 1 tablet by mouth daily., Disp: 30 tablet, Rfl: 2  Allergies  Allergen Reactions  .  Haldol [Haloperidol Decanoate] Anaphylaxis  . Lexapro [Escitalopram Oxalate]     Tremors?  . Penicillins Other (See Comments)    unknown  . Rocephin [Ceftriaxone] Hives     Review of Systems Constitutional: -fever, -chills, -sweats, -unexpected weight change, -decreased appetite, -fatigue Allergy: -sneezing, -itching, -congestion Dermatology: -changing moles, --rash, -lumps ENT: -runny nose, -ear pain, -sore throat, -hoarseness, -sinus pain, -teeth pain, - ringing in ears, -hearing loss, -nosebleeds Cardiology: -chest pain, -palpitations, -swelling, -difficulty breathing when lying flat, -waking up short of breath Respiratory: -cough, -shortness of breath, -difficulty breathing with exercise or exertion, -wheezing, -coughing up blood Gastroenterology: -abdominal pain, -nausea, -vomiting, -diarrhea, -constipation, -blood in stool, -changes in bowel movement, -difficulty swallowing or eating Hematology: -bleeding, -bruising  Musculoskeletal: -joint aches, -muscle aches, -joint swelling, -back pain, -neck pain, -cramping, -changes in gait Ophthalmology: denies vision changes, eye redness, itching, discharge Urology: -burning with urination, -difficulty urinating, -blood in urine, -urinary frequency, -urgency, -incontinence Neurology: -headache, -weakness, -tingling, -numbness, -memory loss, -falls, -dizziness Psychology: -depressed mood, -agitation, -sleep problems Male GU: no testicular mass, pain, no lymph nodes swollen, no swelling, no rash.     Objective:  BP 132/88   Pulse 64  Temp 98.6 F (37 C)   Ht 5' 9.5" (1.765 m)   Wt 201 lb 12.8 oz (91.5 kg)   BMI 29.37 kg/m    BP Readings from Last 3 Encounters:  05/25/20 132/88  05/20/19 132/86  03/04/19 110/80    General appearance: alert, no distress, WD/WN, African American male Skin: unremarkable HEENT: normocephalic, conjunctiva/corneas normal, sclerae anicteric, PERRLA, EOMi, nares patent, no discharge or erythema,  pharynx normal Oral cavity: MMM, tongue normal, teeth normal Neck: supple, no lymphadenopathy, no thyromegaly, no masses, normal ROM, no bruits Chest: non tender, normal shape and expansion Heart: RRR, normal S1, S2, no murmurs Lungs: CTA bilaterally, no wheezes, rhonchi, or rales Abdomen: +bs, soft, non tender, non distended, no masses, no hepatomegaly, no splenomegaly, no bruits Back: non tender, normal ROM, no scoliosis Musculoskeletal: upper extremities non tender, no obvious deformity, normal ROM throughout, lower extremities non tender, no obvious deformity, normal ROM throughout Extremities: no edema, no cyanosis, no clubbing Pulses: 2+ symmetric, upper and lower extremities, normal cap refill Neurological: alert, oriented x 3, CN2-12 intact, strength normal upper extremities and lower extremities, sensation normal throughout, DTRs 2+ throughout, no cerebellar signs, gait normal Psychiatric: normal affect, behavior normal, pleasant  GU: normal male external genitalia,circumcised, nontender, no masses, no hernia, no lymphadenopathy Rectal: declined   Assessment and Plan :   Encounter Diagnoses  Name Primary?  . Encounter for health maintenance examination in adult Yes  . Essential hypertension, benign   . Depression, major, in remission (HCC)   . Family history of lupus erythematosus   . Generalized anxiety disorder   . Vaccine counseling   . Screening for lipid disorders     Today you had a preventative care visit or wellness visit.    Topics today may have included healthy lifestyle, diet, exercise, preventative care, vaccinations, sick and well care, proper use of emergency dept and after hours care, as well as other concerns.     Recommendations: Continue to return yearly for your annual wellness and preventative care visits.  This gives [place] a chance to discuss healthy lifestyle, exercise, vaccinations, review your chart record, and perform screenings where  appropriate.  I recommend you see your eye doctor yearly for routine vision care.  I recommend you see your dentist yearly for routine dental care including hygiene visits twice yearly.   Vaccination recommendations were reviewed  He will get [place] copy of his Covid vaccine  Up to date on Tdap  Patient declines flu vaccines   Screening for cancer: Colon cancer screening:  Age 36yo  Cancer screening You should do a monthly self testicular exam   We discussed PSA, prostate exam, and prostate cancer screening risks/benefits.    Age 89yo  Skin cancer screening: Check your skin regularly for new changes, growing lesions, or other lesions of concern Come in for evaluation if you have skin lesions of concern.  Lung cancer screening: If you have a greater than 30 pack year history of tobacco use, then you qualify for lung cancer screening with a chest CT scan  We currently don't have screenings for other cancers besides breast, cervical, colon, and lung cancers.  If you have a strong family history of cancer or have other cancer screening concerns, please let me know.    Bone health: Get at least 150 minutes of aerobic exercise weekly Get weight bearing exercise at least once weekly  Heart health: Get at least 150 minutes of aerobic exercise weekly Limit alcohol It is important to  maintain a healthy blood pressure and healthy cholesterol numbers   Separate significant issues discussed: HTN - not at goal.  Stop Propranolol Change to Azor (amlodipine Olmesartan 1 tablet daily)    Joandy was seen today for annual exam.  Diagnoses and all orders for this visit:  Encounter for health maintenance examination in adult -     Comprehensive metabolic panel -     CBC -     Lipid panel -     Cancel: POCT Urinalysis DIP (Proadvantage Device)  Essential hypertension, benign -     Comprehensive metabolic panel  Depression, major, in remission (HCC)  Family history of lupus  erythematosus  Generalized anxiety disorder  Vaccine counseling  Screening for lipid disorders -     Lipid panel  Other orders -     amLODipine-olmesartan (AZOR) 5-20 MG tablet; Take 1 tablet by mouth daily.     Follow-up pending labs, 4-6 wk on BP, yearly for physical

## 2020-05-25 NOTE — Patient Instructions (Signed)
Preventative Care for Adults - Male    Thank you for coming in for your well visit today, and thank you for trusting [place] with your care!  If you had a good experience today, please complete the surveys sent by [facility] and consider a review online such as [employer] or Health Grades, refer [place] to a friend  Specific Concerns: Stop Propranolol  Change to Azor (amlodipine Olmesartan 1 tablet daily)  Maintain regular health and wellness exams:  A routine yearly physical is a good way to check in with your primary care provider about your health and preventive screening. It is also an opportunity to share updates about your health and any concerns you have, and receive a thorough all-over exam.   Most health insurance companies pay for at least some preventative services.  Check with your health plan for specific coverages.  What preventative services do men need?  Adult men should have their weight and blood pressure checked regularly.   Men age 4 and older should have their cholesterol levels checked regularly.  Beginning at age 62 and continuing to age 20, men should be screened for colorectal cancer.  Certain people may need continued testing until age 66.  Updating vaccinations is part of preventative care.  Vaccinations help protect against diseases such as the flu.  Osteoporosis is a disease in which the bones lose minerals and strength as we age. Men ages 9 and over should discuss this with their caregivers  Lab tests are generally done as part of preventative care to screen for anemia and blood disorders, to screen for problems with the kidneys and liver, to screen for bladder problems, to check blood sugar, and to check your cholesterol level.  Preventative services generally include counseling about diet, exercise, avoiding tobacco, drugs, excessive alcohol consumption, and sexually transmitted infections.   Xrays and CT scans are not normally done as a preventative test, and  most insurances do not pay for imaging for screening other than as discussed under cancer screens below.   On the other hand, if you have certain medical concerns, imaging may be necessary as a diagnostic test.    Your Medical Team Your medical team starts with [place], your PCP or primary care provider.  Please use our services for your routine care such as physicals, screenings, immunizations, sick visits, and your first stop for general medical concerns.  You can call our number for after hours information for urgent questions that may need attention but cannot wait til the next business day.    Urgent care-urgent cares exist to provide care when your primary care office would typically be closed such as evenings or weekends.   Urgent care is for evaluation of urgent medical problems that do not necessarily require emergency department care, but cannot wait til the next business day when we are open.  Emergency department care-please reserve emergency department care for serious, urgent, possibly life-threatening medical problems.  This includes issues like possible stroke, heart attack, significant injury, mental health crisis, or other urgent need that requires immediate medical attention.     See your dentist office twice yearly for hygiene and cleaning visits.   Brush your teeth and floss your teeth daily.  See your eye doctor yearly for routine eye exam and screenings for glaucoma and retinal disease.     Vaccines:  Stay up to date with your tetanus shots and other required immunizations. You should have a booster for tetanus every 10 years. Be sure to  get your flu shot every year, since 5%-20% of the [place] population comes down with the flu. The flu vaccine changes each year, so being vaccinated once is not enough. Get your shot in the fall, before the flu season peaks.   Other vaccines to consider:  Pneumococcal vaccine to protect against certain types of pneumonia.  This is normally  recommended for adults age 58 or older.  However, adults younger than 35 years old with certain underlying conditions such as diabetes, heart or lung disease should also receive the vaccine.  Shingles vaccine to protect against Varicella Zoster if you are older than age 31, or younger than 35 years old with certain underlying illness.  If you have not had the Shingrix vaccine, please call your insurer to inquire about coverage for the Shingrix vaccine given in 2 doses.   Some insurers cover this vaccine after age 70, some cover this after age 85.  If your insurer covers this, then call to schedule appointment to have this vaccine here  Hepatitis A vaccine to protect against a form of infection of the liver by a virus acquired from food.  Hepatitis B vaccine to protect against a form of infection of the liver by a virus acquired from blood or body fluids, particularly if you work in health care.  If you plan to travel internationally, check with your local health department for specific vaccination recommendations.  Human Papilloma Virus or HPV causes cancer of the cervix, and other infections that can be transmitted from person to person. There is a vaccine for HPV, and males should get immunized between the ages of 14 and 62. It requires a series of 3 shots.   Covid/Coronavirus - Please consider vaccination for your benefit and to help prevent spread of Covid to those around you.       What should I know about Cancer screening? Many types of cancers can be detected early and may often be prevented. Lung Cancer  You should be screened every year for lung cancer if: ? You are a current smoker who has smoked for at least 30 years. ? You are a former smoker who has quit within the past 15 years.  Talk to your health care provider about your screening options, when you should start screening, and how often you should be screened.  Colorectal Cancer  Routine colorectal cancer screening usually  begins at 35 years of age and should be repeated every 5-10 years until you are 35 years old. You may need to be screened more often if early forms of precancerous polyps or small growths are found. Your health care provider may recommend screening at an earlier age if you have risk factors for colon cancer.  Your health care provider may recommend using home test kits to check for hidden blood in the stool.  A small camera at the end of a tube can be used to examine your colon (sigmoidoscopy or colonoscopy). This checks for the earliest forms of colorectal cancer.  Prostate and Testicular Cancer  Depending on your age and overall health, your health care provider may do certain tests to screen for prostate and testicular cancer.  Talk to your health care provider about any symptoms or concerns you have about testicular or prostate cancer.  Skin Cancer  Check your skin from head to toe regularly.  Tell your health care provider about any new moles or changes in moles, especially if: ? There is a change in a mole's size, shape,  or color. ? You have a mole that is larger than a pencil eraser.  Always use sunscreen. Apply sunscreen liberally and repeat throughout the day.  Protect yourself by wearing long sleeves, pants, a wide-brimmed hat, and sunglasses when outside.    GENERAL RECOMMENDATIONS FOR GOOD HEALTH:  Healthy diet:  Eat a variety of foods, including fruit, vegetables, animal or vegetable protein, such as meat, fish, chicken, and eggs, or beans, lentils, tofu, and grains, such as rice.  Drink plenty of water daily.  Decrease saturated fat in the diet, avoid lots of red meat, processed foods, sweets, fast foods, and fried foods.  Exercise:  Aerobic exercise helps maintain good heart health. At least 30-40 minutes of moderate-intensity exercise is recommended. For example, a brisk walk that increases your heart rate and breathing. This should be done on most days of the  week.   Find a type of exercise or a variety of exercises that you enjoy so that it becomes a part of your daily life.  Examples are running, walking, swimming, water aerobics, and biking.  For motivation and support, explore group exercise such as aerobic class, spin class, Zumba, Yoga,or  martial arts, etc.    Set exercise goals for yourself, such as a certain weight goal, walk or run in a race such as a 5k walk/run.  Speak to your primary care provider about exercise goals.   Disease prevention:  If you smoke or chew tobacco, find out from your caregiver how to quit. It can literally save your life, no matter how long you have been a tobacco user. If you do not use tobacco, never begin.   Maintain a healthy diet and normal weight. Increased weight leads to problems with blood pressure and diabetes.   The Body Mass Index or BMI is a way of measuring how much of your body is fat. Having a BMI above 27 increases the risk of heart disease, diabetes, hypertension, stroke and other problems related to obesity. Your caregiver can help determine your BMI and based on it develop an exercise and dietary program to help you achieve or maintain this important measurement at a healthful level.  High blood pressure causes heart and blood vessel problems.  Persistent high blood pressure should be treated with medicine if weight loss and exercise do not work.  Your blood pressure readings per our records:  Fat and cholesterol leaves deposits in your arteries that can block them. This causes heart disease and vessel disease elsewhere in your body.  If your cholesterol is found to be high, or if you have heart disease or certain other medical conditions, then you may need to have your cholesterol monitored frequently and be treated with medication.   Ask if you should have a cardiac stress test if your history suggests this. A stress test is a test done on a treadmill that looks for heart disease. This test can  find disease prior to there being a problem.  Osteoporosis is a disease in which the bones lose minerals and strength as we age. This can result in serious bone fractures. Risk of osteoporosis can be identified using a bone density scan. Men ages 58 and over should discuss this with their caregivers. Ask your caregiver whether you should be taking a calcium supplement and Vitamin D, to reduce the rate of osteoporosis.   Avoid drinking alcohol in excess (more than two drinks per day).  Avoid use of street drugs. Do not share needles with anyone. Ask  for professional help if you need assistance or instructions on stopping the use of alcohol, cigarettes, and/or drugs.  Brush your teeth twice a day with fluoride toothpaste, and floss once a day. Good oral hygiene prevents tooth decay and gum disease. The problems can be painful, unattractive, and can cause other health problems. Visit your dentist for a routine oral and dental check up and preventive care every 6-12 months.      Spiritual and Emotional Health Keeping a healthy spiritual life can help you better manage your physical health. Your spiritual life can help you to cope with any issues that may arise with your physical health.  Balance can keep us healthy and help us to recover.  If you are struggling with your spiritual health there are questions that you may want to ask yourself:  What makes me feel most complete? When do I feel most connected to the rest of the world? Where do I find the most inner strength? What am I doing when I feel whole?  Helpful tips: . Being in nature. Some people feel very connected and at peace when they are walking outdoors or are outside. Marland Kitchen. Helping others. Some feel the largest sense of wellbeing when they are of service to others. Being of service can take on many forms. It can be doing volunteer work, being kind to strangers, or offering a hand to a friend in need. . Gratitude. Some people find they feel  the most connected when they remain grateful. They may make lists of all the things they are grateful for or say a thank you out loud for all they have.    Emotional Health Are you in tune with your emotional health?  Check out this link: http://www.marquez-love.com/Https://www.apa.org/topics/emotions    Legal  Take the time to do a last will and testament, Advanced Directives including Health Care Power of Attorney and Living Will documents.  Don't leave your family with burdens that can be handled ahead of time.   Financial Health . Make sure you use a budget for your personal finances . Make sure you are insured against risks (health insurance, life insurance, auto insurance, etc) . Save more, spend less . Set financial goals . If you need help in this area, good resources include counseling through [employer] or other community resources, have a meeting with a Social research officer, governmentcertified financial planner, and a good resource is [employer] podcast    Top 10 reasons people come to the doctor's office:   (what is your "ounce of prevention")  Skin disorders; Osteoarthritis and joint disorders; Back problems; Cholesterol problems; Upper respiratory conditions, excluding asthma; Anxiety, depression, and bipolar disorder; Chronic neurologic disorders; High blood pressure; Headaches and migraines; and Diabetes.      Safety:  Use seatbelts 100% of the time, whether driving or as a passenger.  Use safety devices such as hearing protection if you work in environments with loud noise or significant background noise.  Use safety glasses when doing any work that could send debris in to the eyes.  Use a helmet if you ride a bike or motorcycle.  Use appropriate safety gear for contact sports.  Talk to your caregiver about gun safety.  Use sunscreen with a SPF (or skin protection factor) of 15 or greater.  Lighter skinned people are at a greater risk of skin cancer. Don't forget to also wear sunglasses in order to protect  your eyes from too much damaging sunlight. Damaging sunlight can accelerate cataract formation.  Keep carbon monoxide and smoke detectors in your home functioning at all times. Change the batteries every 6 months or use a model that plugs into the wall.    Sexual activity: . Sex is a normal part of life and sexual activity can continue into older adulthood for many healthy people.   . If you are having erectile dysfunction issues, please follow up to discuss this further.   . If you are not in a monogamous relationship or have more than one partner, please practice safe sex.  Use condoms. Condoms are used for birth control and to help reduce the spread of sexually transmitted infections (or STIs).  Some of the STIs are gonorrhea (the clap), chlamydia, syphilis, trichomonas, herpes, HPV (human papilloma virus) and HIV (human immunodeficiency virus) which causes AIDS. The herpes, HIV and HPV are viral illnesses that have no cure. These can result in disability, cancer and death.   We are able to test for STIs here at our office.

## 2020-05-26 LAB — CBC
Hematocrit: 46.2 % (ref 37.5–51.0)
Hemoglobin: 15.2 g/dL (ref 13.0–17.7)
MCH: 27.9 pg (ref 26.6–33.0)
MCHC: 32.9 g/dL (ref 31.5–35.7)
MCV: 85 fL (ref 79–97)
Platelets: 138 10*3/uL — ABNORMAL LOW (ref 150–450)
RBC: 5.44 x10E6/uL (ref 4.14–5.80)
RDW: 13.2 % (ref 11.6–15.4)
WBC: 7 10*3/uL (ref 3.4–10.8)

## 2020-05-26 LAB — COMPREHENSIVE METABOLIC PANEL
ALT: 21 IU/L (ref 0–44)
AST: 20 IU/L (ref 0–40)
Albumin/Globulin Ratio: 2.1 (ref 1.2–2.2)
Albumin: 4.8 g/dL (ref 4.0–5.0)
Alkaline Phosphatase: 68 IU/L (ref 44–121)
BUN/Creatinine Ratio: 8 — ABNORMAL LOW (ref 9–20)
BUN: 10 mg/dL (ref 6–20)
Bilirubin Total: 0.5 mg/dL (ref 0.0–1.2)
CO2: 25 mmol/L (ref 20–29)
Calcium: 10.1 mg/dL (ref 8.7–10.2)
Chloride: 101 mmol/L (ref 96–106)
Creatinine, Ser: 1.33 mg/dL — ABNORMAL HIGH (ref 0.76–1.27)
GFR calc Af Amer: 80 mL/min/{1.73_m2} (ref >59)
GFR calc non Af Amer: 69 mL/min/{1.73_m2} (ref >59)
Globulin, Total: 2.3 g/dL (ref 1.5–4.5)
Glucose: 84 mg/dL (ref 65–99)
Potassium: 5.4 mmol/L — ABNORMAL HIGH (ref 3.5–5.2)
Sodium: 137 mmol/L (ref 134–144)
Total Protein: 7.1 g/dL (ref 6.0–8.5)

## 2020-05-26 LAB — LIPID PANEL
Chol/HDL Ratio: 5.2 ratio — ABNORMAL HIGH (ref 0.0–5.0)
Cholesterol, Total: 220 mg/dL — ABNORMAL HIGH (ref 100–199)
HDL: 42 mg/dL (ref >39)
LDL Chol Calc (NIH): 163 mg/dL — ABNORMAL HIGH (ref 0–99)
Triglycerides: 84 mg/dL (ref 0–149)
VLDL Cholesterol Cal: 15 mg/dL (ref 5–40)

## 2020-06-04 ENCOUNTER — Other Ambulatory Visit: Payer: Self-pay | Admitting: Medical

## 2020-06-04 MED ORDER — AMLODIPINE-OLMESARTAN 5-20 MG PO TABS: 1.0000 | ORAL_TABLET | Freq: Every day | ORAL | 3 refills | Status: DC

## 2020-06-04 MED ORDER — ROSUVASTATIN CALCIUM 10 MG PO TABS: 10.0000 mg | ORAL_TABLET | Freq: Every day | ORAL | 3 refills | Status: DC

## 2020-06-22 ENCOUNTER — Ambulatory Visit: Payer: BC Managed Care – PPO | Admitting: Medical
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
## 2020-09-07 ENCOUNTER — Ambulatory Visit: Payer: BC Managed Care – PPO | Admitting: Medical

## 2020-10-09 ENCOUNTER — Other Ambulatory Visit: Payer: Self-pay | Admitting: Medical

## 2020-10-09 ENCOUNTER — Telehealth: Payer: Self-pay | Admitting: Medical

## 2020-10-09 NOTE — Telephone Encounter (Signed)
At last visit he was using a different BP medication.  Is he talking about the one he was taking at his last physical, or is he requesting additional propranolol?

## 2020-10-09 NOTE — Telephone Encounter (Signed)
Pt called and states that he currently has a new job and is between insurances. He is requesting refills on Propranolol. He states he has not been on but bp is running a little high. Pt can be reached at [phone].

## 2020-10-10 ENCOUNTER — Other Ambulatory Visit: Payer: Self-pay | Admitting: Medical

## 2020-10-10 MED ORDER — AMLODIPINE-OLMESARTAN 5-20 MG PO TABS: 1.0000 | ORAL_TABLET | Freq: Every day | ORAL | 0 refills | Status: DC

## 2020-10-10 NOTE — Telephone Encounter (Signed)
Pt. Called back wanting to know if you had called in his propranolol. I asked him if he was on a different BP med and he said no. He never started the medication that were called in last Nov. He said he had stopped all of his medications. He stated he thinks he needs to be on a BP med though because sometimes his BP goes up.

## 2020-10-10 NOTE — Telephone Encounter (Signed)
Okay so I recommend he start the medicine that I thought he had been taking the last 6 months anyhow.  I sent this to the pharmacy  Recheck 1 month on blood pressure

## 2020-10-10 NOTE — Telephone Encounter (Signed)
Pt. Aware of medication and has been scheduled for a 1 month f/u on BP.

## 2020-11-09 ENCOUNTER — Ambulatory Visit: Payer: Self-pay | Admitting: Medical

## 2020-11-19 ENCOUNTER — Ambulatory Visit: Payer: Self-pay | Admitting: Medical

## 2020-12-10 ENCOUNTER — Encounter: Payer: Self-pay | Admitting: Medical

## 2020-12-10 ENCOUNTER — Ambulatory Visit (INDEPENDENT_AMBULATORY_CARE_PROVIDER_SITE_OTHER): Payer: BC Managed Care – PPO | Admitting: Medical

## 2020-12-10 ENCOUNTER — Other Ambulatory Visit: Payer: Self-pay

## 2020-12-10 VITALS — BP 120/60 | HR 82 | Ht 70.0 in | Wt 168.0 lb

## 2020-12-10 DIAGNOSIS — R7989 Other specified abnormal findings of blood chemistry: Secondary | ICD-10-CM | POA: Diagnosis not present

## 2020-12-10 DIAGNOSIS — F411 Generalized anxiety disorder: Secondary | ICD-10-CM | POA: Diagnosis not present

## 2020-12-10 DIAGNOSIS — E785 Hyperlipidemia, unspecified: Secondary | ICD-10-CM

## 2020-12-10 DIAGNOSIS — I10 Essential (primary) hypertension: Secondary | ICD-10-CM

## 2020-12-10 DIAGNOSIS — R5383 Other fatigue: Secondary | ICD-10-CM

## 2020-12-10 LAB — POCT URINALYSIS DIP (PROADVANTAGE DEVICE)
Bilirubin, UA: NEGATIVE
Blood, UA: NEGATIVE
Glucose, UA: NEGATIVE mg/dL
Ketones, POC UA: NEGATIVE mg/dL
Leukocytes, UA: NEGATIVE
Nitrite, UA: NEGATIVE
Protein Ur, POC: NEGATIVE mg/dL
Specific Gravity, Urine: 1.01
Urobilinogen, Ur: 0.2
pH, UA: 6 (ref 5.0–8.0)

## 2020-12-10 NOTE — Patient Instructions (Signed)
High blood pressure  For now either use Amlodine Olmesartan 5/20mg , 1/2 tablet daily using a pill cutter, or we can try a different medication going forward  This medication doesn't work propertly missing dose, so either take 1/2 tablet daily or lets chagne to a different medication since your blood pressure look much improved

## 2020-12-10 NOTE — Progress Notes (Addendum)
Subjective:  Kenneth Graham is a 35 y.o. male who presents for Chief Complaint  Patient presents with   Follow-up    Follow up on blood pressure      Here for hypertension f/u.  Compliant with Amlodipine Olmesartan 5/20mg  daily, but sometimes forgets.  Drinks a Artist of Peace tea to help with blood pressure.  Came off truck regularly early in the year.  Is a Customer service manager currently and thinks this is better for his mental and physical health than being in the truck.   Eating clean.  Exercising.    05/2020 we recommended Crestor given lipid findings but he has not been taking this.    Nonsmoker.  Has occasional wine, but not regular alcohol.  Does smoke some marijuana occasionally.  No other drugs.   He reports decreased energy, fatigue at times.  No other aggravating or relieving factors.    No other c/o.  Family History  Problem Relation Age of Onset   Depression Mother    Bipolar disorder Mother    Hyperlipidemia Father    Cancer Paternal Grandfather        lung   Cancer Cousin    Cancer Cousin    Heart disease Neg Hx    Stroke Neg Hx    Diabetes Neg Hx     The following portions of the patient's history were reviewed and updated as appropriate: allergies, current medications, past family history, past medical history, past social history, past surgical history and problem list.  ROS Otherwise as in subjective above    Objective: BP 120/60   Pulse 82   Ht 5\' 10"  (1.778 m)   Wt 168 lb (76.2 kg)   SpO2 97%   BMI 24.11 kg/m   BP Readings from Last 3 Encounters:  12/24/20 126/78  12/10/20 120/60  05/25/20 132/88   Wt Readings from Last 3 Encounters:  12/24/20 166 lb 3.2 oz (75.4 kg)  12/10/20 168 lb (76.2 kg)  05/25/20 201 lb 12.8 oz (91.5 kg)    General appearance: alert, no distress, well developed, well nourished Neck: supple, no lymphadenopathy, no thyromegaly, no masses, no bruits Heart: RRR, normal S1, S2, no murmurs Lungs: CTA bilaterally, no  wheezes, rhonchi, or rales Abdomen: +bs, soft, non tender, non distended, no masses, no hepatomegaly, no splenomegaly, no bruits Pulses: 2+ radial pulses, 2+ pedal pulses, normal cap refill Ext: no edema   Assessment: Encounter Diagnoses  Name Primary?   Essential hypertension, benign Yes   Generalized anxiety disorder    Serum calcium elevated    Elevated serum creatinine    Hyperlipidemia, unspecified hyperlipidemia type    Fatigue, unspecified type      Plan: HTN - controlled. But he has only been taking medication 50-60% of the time.  Discussed some options.   Continue BP monitoring.  Continue health diet, regular exercise.   Patient Instructions  High blood pressure For now either use Amlodine Olmesartan 5/20mg , 1/2 tablet daily using a pill cutter, or we can try a different medication going forward This medication doesn't work propertly missing dose, so either take 1/2 tablet daily or lets chagne to a different medication since your blood pressure look much improved Hyperlipidemia - discussed findings last visit.  He has made some lifestyle changes.  Repeat labs today to consider whether to be on medication or not.  Discussed low cholesterol diet, continuing regularly exercise  repeat labs given elevated creatinine and calcium last visit.   C/t good hydration, avoid  NSAIDs.  Discussed causes of elevated creatinine.    Fatigue - updated labs today  Harlyn was seen today for follow-up.  Diagnoses and all orders for this visit:  Essential hypertension, benign -     Basic metabolic panel  Generalized anxiety disorder  Serum calcium elevated  Elevated serum creatinine -     POCT Urinalysis DIP (Proadvantage Device) -     Basic metabolic panel  Hyperlipidemia, unspecified hyperlipidemia type -     Lipid panel  Fatigue, unspecified type   Follow up: pending labs

## 2020-12-11 ENCOUNTER — Other Ambulatory Visit: Payer: Self-pay | Admitting: Medical

## 2020-12-11 ENCOUNTER — Telehealth: Payer: Self-pay | Admitting: Medical

## 2020-12-11 LAB — LIPID PANEL
Chol/HDL Ratio: 3.8 ratio (ref 0.0–5.0)
Cholesterol, Total: 168 mg/dL (ref 100–199)
HDL: 44 mg/dL (ref >39)
LDL Chol Calc (NIH): 104 mg/dL — ABNORMAL HIGH (ref 0–99)
Triglycerides: 113 mg/dL (ref 0–149)
VLDL Cholesterol Cal: 20 mg/dL (ref 5–40)

## 2020-12-11 LAB — BASIC METABOLIC PANEL
BUN/Creatinine Ratio: 6 — ABNORMAL LOW (ref 9–20)
BUN: 7 mg/dL (ref 6–20)
CO2: 24 mmol/L (ref 20–29)
Calcium: 9.8 mg/dL (ref 8.7–10.2)
Chloride: 103 mmol/L (ref 96–106)
Creatinine, Ser: 1.08 mg/dL (ref 0.76–1.27)
Glucose: 64 mg/dL — ABNORMAL LOW (ref 65–99)
Potassium: 4.6 mmol/L (ref 3.5–5.2)
Sodium: 139 mmol/L (ref 134–144)
eGFR: 92 mL/min/{1.73_m2} (ref >59)

## 2020-12-11 MED ORDER — OLMESARTAN MEDOXOMIL 20 MG PO TABS: 20.0000 mg | ORAL_TABLET | Freq: Every day | ORAL | 3 refills | Status: DC

## 2020-12-11 NOTE — Telephone Encounter (Signed)
See other lab message.  I also changed his BP medication to plain Olmesartan 20 without the Amlodipine since he was only using the medication part of the time.  Given his weight loss and readings, lets change to this.

## 2020-12-11 NOTE — Telephone Encounter (Signed)
Left detail message on machine for patient. 

## 2020-12-24 ENCOUNTER — Other Ambulatory Visit: Payer: Self-pay

## 2020-12-24 ENCOUNTER — Other Ambulatory Visit: Payer: Self-pay | Admitting: Medical

## 2020-12-24 ENCOUNTER — Encounter: Payer: Self-pay | Admitting: Medical

## 2020-12-24 ENCOUNTER — Ambulatory Visit: Payer: Self-pay | Admitting: Medical

## 2020-12-24 VITALS — BP 126/78 | HR 90 | Ht 70.0 in | Wt 166.2 lb

## 2020-12-24 DIAGNOSIS — R5383 Other fatigue: Secondary | ICD-10-CM

## 2020-12-24 DIAGNOSIS — R7309 Other abnormal glucose: Secondary | ICD-10-CM

## 2020-12-24 DIAGNOSIS — Z0289 Encounter for other administrative examinations: Secondary | ICD-10-CM

## 2020-12-24 DIAGNOSIS — I10 Essential (primary) hypertension: Secondary | ICD-10-CM

## 2020-12-24 LAB — POCT URINALYSIS DIP (PROADVANTAGE DEVICE)
Bilirubin, UA: NEGATIVE
Blood, UA: NEGATIVE
Glucose, UA: NEGATIVE mg/dL
Ketones, POC UA: NEGATIVE mg/dL
Leukocytes, UA: NEGATIVE
Nitrite, UA: NEGATIVE
Protein Ur, POC: NEGATIVE mg/dL
Specific Gravity, Urine: 1.015
Urobilinogen, Ur: 0.2
pH, UA: 7.5 (ref 5.0–8.0)

## 2020-12-24 NOTE — Progress Notes (Signed)
Commercial Driver Medical Examination   Kenneth Graham is a 35 y.o. male who presents today for a commercial driver fitness determination physical exam.  Medical care team includes: Parilee Hally, Kermit Balo, PA-C  The patient reports no problems  Review of Systems A comprehensive review of systems was reviewed and noted as below:  Eye: - corrective lenses, -lasik surgery or other eye surgery, -glaucoma, -cataracts, -macular degeneration, -monocular vision, -medication for eye condition, -blurred vision,   Ears: -hearing problems, - hearing aids, -ear pain, -ear drainage, -ear fullness, -tinnitus, -recurrent ear infection, -previous ear surgery, - vertigo, -menieres disease  Endocrine: -polydipsia, -polyuria, -weight loss, -fainting, -dizziness, - altered or loss of consciousness, -hypoglycemia  Cardiovascular: -heart disease, -CHF, -heart attack, -cardiac stents, -bypass surgery, -other heart surgery, +hypertension, -blood clots, -pacemaker, -medications for heart condition, -chest pain, -SOB, -palpitations, -fainting, -dizziness, -dyspnea  Respiratory: -asthma, -COPD, other lung disease, -smoker, -chest tightness, - wheezing, -snoring, -daytime sleepiness, -sleep apnea or uses CPAP, -narcolepsy, -sleeping disorder  Allergy: -uncontrollable sneezing or allergy symptoms  Musculoskeletal: -missing body parts, -muscle disease, -bone disease, -spine injury, -low back pain, -medication for joints, bones, muscles or pain, -physical limitations, -joint pain, -neck pain, -limitations of neck ROM, -back surgery, orthopedic surgery, -rheumatologic condition, -gout  Neurologic: -neurologic disease, -dementia, -seizures, -parkinsons, -tremor, -memory problems, -weakness, -numbness, -tingling, -medication for neurologic condition, -medications for sleep condition  Gastric: -abdominal pain, -chronic diarrhea or IBS, -uncontrollable nausea  Kidney/Renal: -hematuria, -dialysis, kidney disease, polycystic  kidney disease  Psychiatric: -homicidal thoughts, -suicidal thoughts, -prior suicide attempts, -get into fights/hurting others, -memory or concentration problems, -delusions, -hallucinations, -hospitalizaiton for mental health problem, -depression, -anxiety, -bipolar  Drug use: - none   Reviewed their medical, surgical, family, social, medication, and allergy history and updated chart as appropriate.        Objective:   Physical Exam  BP 126/78   Pulse 90   Ht 5\' 10"  (1.778 m)   Wt 166 lb 3.2 oz (75.4 kg)   SpO2 98%   BMI 23.85 kg/m   General appearance: alert, no distress, WD/WN, African American male Skin: unremarkable HEENT: normocephalic, conjunctiva/corneas normal, sclerae anicteric, PERRLA, EOMi, nares patent, no discharge or erythema, pharynx normal Oral cavity: MMM, tongue normal, teeth normal Neck: supple, no lymphadenopathy, no thyromegaly, no masses, normal ROM Chest: non tender, normal shape and expansion Heart: RRR, normal S1, S2, no murmurs Lungs: CTA bilaterally, no wheezes, rhonchi, or rales Abdomen: +bs, soft, non tender, non distended, no masses, no hepatomegaly, no splenomegaly, no bruits Back: non tender, normal ROM, no scoliosis Musculoskeletal: upper extremities non tender, no obvious deformity, normal ROM throughout, lower extremities non tender, no obvious deformity, normal ROM throughout Extremities: no edema, no cyanosis, no clubbing Pulses: 2+ symmetric, upper and lower extremities, normal cap refill Neurological: alert, oriented x 3, CN2-12 intact, strength normal upper extremities and lower extremities, sensation normal throughout, DTRs 2+ throughout, no cerebellar signs, gait normal Psychiatric: normal affect, behavior normal, pleasant  GU: normal male external genitalia, nontender, no masses, no hernia, no lymphadenopathy Rectal: deferred   Assessment:   Encounter Diagnoses  Name Primary?   Health examination of defined subpopulation Yes    Essential hypertension, benign       Plan:     DOT forms completed  Hypertension currently controlled without medication.  He notes home readings are normal for several months now without medication  Advised the submit the medical certificate page to the [facility]

## 2020-12-24 NOTE — Progress Notes (Signed)
error 

## 2020-12-24 NOTE — Addendum Note (Signed)
Addended by: Victorio Palm on: 12/24/2020 01:03 PM   Modules accepted: Orders

## 2020-12-31 ENCOUNTER — Other Ambulatory Visit: Payer: BC Managed Care – PPO

## 2020-12-31 ENCOUNTER — Other Ambulatory Visit: Payer: Self-pay

## 2020-12-31 DIAGNOSIS — R7309 Other abnormal glucose: Secondary | ICD-10-CM

## 2020-12-31 DIAGNOSIS — R5383 Other fatigue: Secondary | ICD-10-CM

## 2020-12-31 DIAGNOSIS — F325 Major depressive disorder, single episode, in full remission: Secondary | ICD-10-CM | POA: Diagnosis not present

## 2020-12-31 DIAGNOSIS — E162 Hypoglycemia, unspecified: Secondary | ICD-10-CM | POA: Diagnosis not present

## 2020-12-31 DIAGNOSIS — E569 Vitamin deficiency, unspecified: Secondary | ICD-10-CM | POA: Diagnosis not present

## 2021-01-01 LAB — GLUCOSE, RANDOM: Glucose: 85 mg/dL (ref 65–99)

## 2021-01-01 LAB — TESTOSTERONE: Testosterone: 516 ng/dL (ref 264–916)

## 2021-01-01 LAB — VITAMIN B12: Vitamin B-12: 484 pg/mL (ref 232–1245)

## 2021-01-01 LAB — TSH: TSH: 3.38 u[IU]/mL (ref 0.450–4.500)

## 2021-01-08 ENCOUNTER — Other Ambulatory Visit: Payer: Self-pay | Admitting: Internal Medicine

## 2021-01-08 DIAGNOSIS — R5383 Other fatigue: Secondary | ICD-10-CM

## 2021-02-19 ENCOUNTER — Encounter: Payer: Self-pay | Admitting: Medical

## 2021-02-19 DIAGNOSIS — E569 Vitamin deficiency, unspecified: Secondary | ICD-10-CM | POA: Insufficient documentation

## 2021-03-25 ENCOUNTER — Institutional Professional Consult (permissible substitution): Payer: BC Managed Care – PPO | Admitting: Neurology

## 2021-03-27 ENCOUNTER — Other Ambulatory Visit: Payer: Self-pay

## 2021-03-27 ENCOUNTER — Telehealth (INDEPENDENT_AMBULATORY_CARE_PROVIDER_SITE_OTHER): Payer: Self-pay | Admitting: Family Medicine

## 2021-03-27 VITALS — Temp 97.8°F | Wt 155.0 lb

## 2021-03-27 DIAGNOSIS — J029 Acute pharyngitis, unspecified: Secondary | ICD-10-CM

## 2021-03-27 DIAGNOSIS — R509 Fever, unspecified: Secondary | ICD-10-CM

## 2021-03-27 NOTE — Progress Notes (Signed)
   Subjective:    Patient ID: Luellen Pucker III, male    DOB: Nov 06, 1985, 35 y.o.   MRN: [id number]  HPI Documentation for virtual audio and video telecommunications through [employer] encounter: The patient was located at home. 2 patient identifiers used.  The provider was located in the office. The patient did consent to this visit and is aware of possible charges through their insurance for this visit. The other persons participating in this telemedicine service were none. Time spent on call was 5 minutes and in review of previous records >20 minutes total for counseling and coordination of care. This virtual service is not related to other E/M service within previous 7 days.  He states that on Monday he developed myalgias, sore throat, fever to 103.2.  He states that several hours prior to this he was exposed to somebody at work but I explained that that was too soon to blame it on that exposure.  His son recently had symptoms and did get strep as well as COVID testing done which was negative.  Today he has a slight sore throat as well as myalgias but is not taking any medicine today.  Review of Systems     Objective:   Physical Exam Alert and in no distress otherwise not examined       Assessment & Plan:  Sore throat  Fever, unspecified fever cause He does have a COVID test and I recommended that he take that.  If its posi can return to work.  Explained that I do not think at this point an antibiotic would be useful.  He was comfortable with that.  Tive we will let me know.  He is to take 2 Tylenol 4 times per day for the fever aches and pains, gargle as needed.  Explained that as long as he is fever free for 24 hours

## 2021-06-07 ENCOUNTER — Encounter: Payer: BC Managed Care – PPO | Admitting: Medical

## 2021-06-07 DIAGNOSIS — Z0000 Encounter for general adult medical examination without abnormal findings: Secondary | ICD-10-CM

## 2021-06-07 NOTE — Progress Notes (Deleted)
Subjective:   HPI  Kenneth Graham is a 35 y.o. male who presents for No chief complaint on file.   Patient Care Team: Jennavie Martinek, Cleda Mccreedy as PCP - General (Family Medicine) Sees dentist Sees eye doctor  Concerns:     No particular issues.  HTN - compliant with medication.  Usually BP is good at home, in the 120/80s.   Has a BP device with phone app.  Sometimes is seeing higher numbers.   Sometimes sees 150/100.  Mood is fine.  No recent problems.  No recent depressed moods.  No mood swings.  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    Reviewed their medical, surgical, family, social, medication, and allergy history and updated chart as appropriate.  Past Medical History:  Diagnosis Date   Allergy    Anxiety    Asthma    childhood   Bipolar disorder (HCC)    Cocaine abuse (HCC)    in past   Depression    GERD (gastroesophageal reflux disease)    Hypertension    Palpitation    Psychiatric diagnosis    Dr. Karmen Bongo, hospitalization [facility] in the past   Seasonal allergies    Thrombocytopenia (HCC)    remote past?   Wears glasses     Past Surgical History:  Procedure Laterality Date   KNEE SURGERY  2000   arthoscopic   MASTOIDECTOMY     cellulitis, age 39yo; left    Family History  Problem Relation Age of Onset   Depression Mother    Bipolar disorder Mother    Hyperlipidemia Father    Cancer Paternal Grandfather        lung   Cancer Cousin    Cancer Cousin    Heart disease Neg Hx    Stroke Neg Hx    Diabetes Neg Hx      Current Outpatient Medications:    fluticasone (FLONASE) 50 MCG/ACT nasal spray, Place 2 sprays into both nostrils daily. Reported on 01/14/2016, Disp: , Rfl:    olmesartan (BENICAR) 20 MG tablet, Take 1 tablet (20 mg total) by mouth daily. (Patient not taking: Reported on 03/27/2021), Disp: 90 tablet, Rfl: 3  Allergies  Allergen Reactions   Haldol [Haloperidol Decanoate]  Anaphylaxis   Lexapro [Escitalopram Oxalate]     Tremors?   Penicillins Other (See Comments)    unknown   Rocephin [Ceftriaxone] Hives     Review of Systems Constitutional: -fever, -chills, -sweats, -unexpected weight change, -decreased appetite, -fatigue Allergy: -sneezing, -itching, -congestion Dermatology: -changing moles, --rash, -lumps ENT: -runny nose, -ear pain, -sore throat, -hoarseness, -sinus pain, -teeth pain, - ringing in ears, -hearing loss, -nosebleeds Cardiology: -chest pain, -palpitations, -swelling, -difficulty breathing when lying flat, -waking up short of breath Respiratory: -cough, -shortness of breath, -difficulty breathing with exercise or exertion, -wheezing, -coughing up blood Gastroenterology: -abdominal pain, -nausea, -vomiting, -diarrhea, -constipation, -blood in stool, -changes in bowel movement, -difficulty swallowing or eating Hematology: -bleeding, -bruising  Musculoskeletal: -joint aches, -muscle aches, -joint swelling, -back pain, -neck pain, -cramping, -changes in gait Ophthalmology: denies vision changes, eye redness, itching, discharge Urology: -burning with urination, -difficulty urinating, -blood in urine, -urinary frequency, -urgency, -incontinence Neurology: -headache, -weakness, -tingling, -numbness, -memory loss, -falls, -dizziness Psychology: -depressed mood, -agitation, -sleep problems Male GU: no testicular mass, pain, no lymph nodes swollen, no swelling, no rash.     Objective:  There were no vitals taken for this visit.  BP Readings from Last 3 Encounters:  12/24/20 126/78  12/10/20 120/60  05/25/20 132/88    General appearance: alert, no distress, WD/WN, African American male Skin: unremarkable HEENT: normocephalic, conjunctiva/corneas normal, sclerae anicteric, PERRLA, EOMi, nares patent, no discharge or erythema, pharynx normal Oral cavity: MMM, tongue normal, teeth normal Neck: supple, no lymphadenopathy, no thyromegaly, no  masses, normal ROM, no bruits Chest: non tender, normal shape and expansion Heart: RRR, normal S1, S2, no murmurs Lungs: CTA bilaterally, no wheezes, rhonchi, or rales Abdomen: +bs, soft, non tender, non distended, no masses, no hepatomegaly, no splenomegaly, no bruits Back: non tender, normal ROM, no scoliosis Musculoskeletal: upper extremities non tender, no obvious deformity, normal ROM throughout, lower extremities non tender, no obvious deformity, normal ROM throughout Extremities: no edema, no cyanosis, no clubbing Pulses: 2+ symmetric, upper and lower extremities, normal cap refill Neurological: alert, oriented x 3, CN2-12 intact, strength normal upper extremities and lower extremities, sensation normal throughout, DTRs 2+ throughout, no cerebellar signs, gait normal Psychiatric: normal affect, behavior normal, pleasant  GU: normal male external genitalia,circumcised, nontender, no masses, no hernia, no lymphadenopathy Rectal: declined   Assessment and Plan :   No diagnosis found.   Today you had a preventative care visit or wellness visit.    Topics today may have included healthy lifestyle, diet, exercise, preventative care, vaccinations, sick and well care, proper use of emergency dept and after hours care, as well as other concerns.     Recommendations: Continue to return yearly for your annual wellness and preventative care visits.  This gives [place] a chance to discuss healthy lifestyle, exercise, vaccinations, review your chart record, and perform screenings where appropriate.  I recommend you see your eye doctor yearly for routine vision care.  I recommend you see your dentist yearly for routine dental care including hygiene visits twice yearly.   Vaccination recommendations were reviewed Immunization History  Administered Date(s) Administered   [employer] Vaccination 11/25/2019, 12/23/2019   Tdap 09/16/2007, 11/12/2018   Patient declines flu  vaccines   Screening for cancer: Colon cancer screening:  Age 44yo  Cancer screening You should do a monthly self testicular exam   We discussed PSA, prostate exam, and prostate cancer screening risks/benefits.    Age 87yo  Skin cancer screening: Check your skin regularly for new changes, growing lesions, or other lesions of concern Come in for evaluation if you have skin lesions of concern.  Lung cancer screening: If you have a greater than 30 pack year history of tobacco use, then you qualify for lung cancer screening with a chest CT scan  We currently don't have screenings for other cancers besides breast, cervical, colon, and lung cancers.  If you have a strong family history of cancer or have other cancer screening concerns, please let me know.    Bone health: Get at least 150 minutes of aerobic exercise weekly Get weight bearing exercise at least once weekly   Heart health: Get at least 150 minutes of aerobic exercise weekly Limit alcohol It is important to maintain a healthy blood pressure and healthy cholesterol numbers   Separate significant issues discussed: HTN - not at goal.  Stop Propranolol Change to Azor (amlodipine Olmesartan 1 tablet daily)  Echo?  psyhc  There are no diagnoses linked to this encounter.    Follow-up pending labs, 4-6 wk on BP, yearly for physical

## 2021-06-10 ENCOUNTER — Encounter: Payer: Self-pay | Admitting: Family Medicine

## 2021-06-10 ENCOUNTER — Telehealth (INDEPENDENT_AMBULATORY_CARE_PROVIDER_SITE_OTHER): Payer: BC Managed Care – PPO | Admitting: Family Medicine

## 2021-06-10 ENCOUNTER — Other Ambulatory Visit: Payer: Self-pay

## 2021-06-10 VITALS — BP 138/88 | HR 91 | Temp 100.3°F | Ht 70.0 in | Wt 160.0 lb

## 2021-06-10 DIAGNOSIS — Z8709 Personal history of other diseases of the respiratory system: Secondary | ICD-10-CM | POA: Diagnosis not present

## 2021-06-10 DIAGNOSIS — J111 Influenza due to unidentified influenza virus with other respiratory manifestations: Secondary | ICD-10-CM

## 2021-06-10 DIAGNOSIS — R0602 Shortness of breath: Secondary | ICD-10-CM | POA: Diagnosis not present

## 2021-06-10 MED ORDER — ALBUTEROL SULFATE HFA 108 (90 BASE) MCG/ACT IN AERS: 2.0000 | INHALATION_SPRAY | Freq: Four times a day (QID) | RESPIRATORY_TRACT | 1 refills | Status: DC | PRN

## 2021-06-10 MED ORDER — XOFLUZA (40 MG DOSE) 1 X 40 MG PO TBPK: 40.0000 mg | ORAL_TABLET | Freq: Once | ORAL | 0 refills | Status: AC

## 2021-06-10 NOTE — Patient Instructions (Signed)
Drink plenty of fluids. Be sure not to take too much acetaminophen (tylenol)--this is found in your plain tylenol as well as most of the combination cold medications that you're taking. Stop the plain tylenol. Use ibuprofen or aleve as needed for pain, fever, body aches.  Take as directed (you can take 3-4 tablets of ibuprofen at once, with food, three times daily (every 8 hours).  If you prefer Aleve, take 1-2 tablets twice daily with food. Don't take both aleve and ibuprofen (only one or the other).  This can be taken when you have fever or pain, but you aren't due for another dose of your acetaminophen-containing medication yet.  Do not take the theraflu and Tylenol cold and flu together, as they overlap on some ingredients (both have decongestants, and acetaminophen). Verify that you current medications do not contain guaifenesin, and if they don't, get a separate PLAIN mucinex 12 hour tablet and take it twice daily.  This will keep your mucus and phlegm thin.

## 2021-06-10 NOTE — Progress Notes (Signed)
Start time: 1:31 End time: 2:04  Virtual Visit via Video Note  I connected with Kenneth Graham on 06/10/21 by a video enabled telemedicine application and verified that I am speaking with the correct person using two identifiers.  Location: Patient: home Provider: office   I discussed the limitations of evaluation and management by telemedicine and the availability of in person appointments. The patient expressed understanding and agreed to proceed.  History of Present Illness:  Chief Complaint  Patient presents with   Cough    VIRTUAL coughing up brown mucus and has fever that will not break. Symptoms started yesterday. His son tested positive for flu Sat am.    29 yo son was diagnosed with the flu 2 days.  Patient was fine until yesterday.  He then started with fever, sweats, chills, body aches and the cough got worse today and last night.  Thick nasal mucus, brown and yellow.  He is coughing thick, brown phlegm. He reports some shortness of breath, feels different from his asthma.  He has some chest tightness.  Son was prescribed tamiflu, but it gave him nightmares. He recalls taking the tamiflu many years ago, and recalls nightmares.  He doesn't want Tamiflu prescription. H/o childhood asthma, no issues now, but does feel some tightness in chest.  Taking liquid theraflu, 500mg  tylenol and tylenol cold and flu.  He hasn't taken them together, separated by 4-5 hours.   PMH, PSH, SH reviewed  Outpatient Encounter Medications as of 06/10/2021  Medication Sig Note   acetaminophen (TYLENOL) 500 MG tablet Take 1,000 mg by mouth every 6 (six) hours as needed. 06/10/2021: Took at 6am   Chlorphen-Pseudoephed-APAP (THERAFLU FLU/COLD PO) Take 20 mLs by mouth daily. 06/10/2021: Took yesterday afternoon   Pseudoeph-Doxylamine-DM-APAP (NYQUIL PO) Take 30 mLs by mouth at bedtime. 06/10/2021: Last dose last night   Pseudoephedrine-APAP-DM (TYLENOL COLD/FLU DAY PO) Take 2 tablets by mouth  daily. 06/10/2021: Last dose 10:00am   fluticasone (FLONASE) 50 MCG/ACT nasal spray Place 2 sprays into both nostrils daily. Reported on 01/14/2016 (Patient not taking: Reported on 06/10/2021) 03/04/2019: Prn    olmesartan (BENICAR) 20 MG tablet Take 1 tablet (20 mg total) by mouth daily. (Patient not taking: Reported on 03/27/2021) 06/10/2021: Has been off for a while, since wt loss and BP's have been fine   No facility-administered encounter medications on file as of 06/10/2021.   ROS: URI symptoms, headache, fever and body aches per HPI.  No n/v/d. Some SOB. No rashes.  See HPI    Observations/Objective:  BP 138/88   Pulse 91   Temp 100.3 F (37.9 C) (Temporal)   Ht 5\' 10"  (1.778 m)   Wt 160 lb (72.6 kg)   BMI 22.96 kg/m   Pleasant male, in no distress. Occasional cough during visit. He is alert and oriented, grossly normal cranial nerves. Normal grooming. Exam is limited due to virtual nature of the visit.   Assessment and Plan:  Influenza - son tested +, and pt with classic symptoms. Refuses Tamiflu due to SE. Rx Xofluza. Supportive measures reviewed in detail - Plan: Baloxavir Marboxil,40 MG Dose, (XOFLUZA, 40 MG DOSE,) 1 x 40 MG TBPK  Shortness of breath - Plan: albuterol (VENTOLIN HFA) 108 (90 Base) MCG/ACT inhaler  History of asthma - Plan: albuterol (VENTOLIN HFA) 108 (90 Base) MCG/ACT inhaler  Declines tamiflu. Willing to take Xofluza Prescribe albuterol to use prn. Chest congestion might improve with mucinex.  Work excuse-- Seen for illness today. Needs to be  out for 3 days.  May return if afebrile and feeling better later in the week.  He will contact us if more time is needed for new note. He will pick up note (having trouble with MyChart).    Follow Up Instructions:    I discussed the assessment and treatment plan with the patient. The patient was provided an opportunity to ask questions and all were answered. The patient agreed with the plan and demonstrated  an understanding of the instructions.   The patient was advised to call back or seek an in-person evaluation if the symptoms worsen or if the condition fails to improve as anticipated.  I spent 34 minutes dedicated to the care of this patient, including pre-visit review of records, face to face time, post-visit ordering of testing and documentation.    Lavonda Jumbo, MD

## 2021-06-11 ENCOUNTER — Encounter: Payer: Self-pay | Admitting: Family Medicine

## 2021-06-18 ENCOUNTER — Encounter: Payer: Self-pay | Admitting: Medical

## 2021-08-30 ENCOUNTER — Telehealth: Payer: BC Managed Care – PPO | Admitting: Medical

## 2021-08-30 ENCOUNTER — Other Ambulatory Visit: Payer: Self-pay

## 2021-08-30 VITALS — Temp 98.1°F

## 2021-08-30 DIAGNOSIS — R519 Headache, unspecified: Secondary | ICD-10-CM

## 2021-08-30 DIAGNOSIS — U071 COVID-19: Secondary | ICD-10-CM | POA: Diagnosis not present

## 2021-08-30 DIAGNOSIS — R051 Acute cough: Secondary | ICD-10-CM | POA: Diagnosis not present

## 2021-08-30 MED ORDER — EMERGEN-C IMMUNE PLUS PO PACK: 1.0000 | PACK | Freq: Two times a day (BID) | ORAL | 0 refills | Status: AC

## 2021-08-30 NOTE — Patient Instructions (Signed)
°  General recommendations: I recommend you rest, hydrate well with water and clear fluids throughout the day.   You can use Tylenol for pain or fever You can use over the counter Delsym for cough, or you can continue the NyQuil DayQuil combination.  Avoid doubling up on other similar medications given history of high blood pressure.. You can use over the counter Emetrol for nausea.   Begin over-the-counter emergenC immune plus vitamin pack  If you are having trouble breathing, if you are very weak, have high fever 103 or higher consistently despite Tylenol, or uncontrollable nausea and vomiting, then call or go to the emergency department.    If you have other questions or have other symptoms or questions you are concerned about then please make a virtual visit  Covid symptoms such as fatigue and cough can linger over 2 weeks, even after the initial fever, aches, chills, and other initial symptoms.   Self Quarantine: The CDC, Centers for Disease Control has recommended a self quarantine of 5 days from the start of your illness until you are symptom-free including at least 24 hours of no symptoms including no fever, no shortness of breath, and no body aches and chills, by day 5 before returning to work or general contact with the public.  What does self quarantine mean: avoiding contact with people as much as possible.   Particularly in your house, isolate your self from others in a separate room, wear a mask when possible in the room, particularly if coughing a lot.   Have others bring food, water, medications, etc., to your door, but avoid direct contact with your household contacts during this time to avoid spreading the infection to them.   If you have a separate bathroom and living quarters during the next 2 weeks away from others, that would be preferable.    If you can't completely isolate, then wear a mask, wash hands frequently with soap and water for at least 15 seconds, minimize close  contact with others, and have a friend or family member check regularly from a distance to make sure you are not getting seriously worse.     You should not be going out in public, should not be going to stores, to work or other public places until all your symptoms have resolved and at least 5 days + 24 hours of no symptoms at all have transpired.   Ideally you should avoid contact with others for a full 5 days if possible.  One of the goals is to limit spread to high risk people; people that are older and elderly, people with multiple health issues like diabetes, heart disease, lung disease, and anybody that has weakened immune systems such as people with cancer or on immunosuppressive therapy.

## 2021-08-30 NOTE — Progress Notes (Signed)
Subjective:     Patient ID: Kenneth Graham, male   DOB: 27-Feb-1986, 36 y.o.   MRN: [id number]  This visit type was conducted due to national recommendations for restrictions regarding the COVID-19 Pandemic (e.g. social distancing) in an effort to limit this patient's exposure and mitigate transmission in our community.  Due to their co-morbid illnesses, this patient is at least at moderate risk for complications without adequate follow up.  This format is felt to be most appropriate for this patient at this time.    Documentation for virtual audio and video telecommunications through [facility] encounter:  The patient was located at home. The provider was located in the office. The patient did consent to this visit and is aware of possible charges through their insurance for this visit.  The other persons participating in this telemedicine service were none. Time spent on call was 20 minutes and in review of previous records 20 minutes total.  This virtual service is not related to other E/M service within previous 7 days.   HPI Chief Complaint  Patient presents with   Covid Positive    Covid positive- symptoms started Wednesday- Sore throat, HA, back pain, fatigue, fever 99-102. Tested positive yesterday.    Virtual consult for COVID infection.  Symptoms began 2 and half days ago.  Symptoms have included terrible headache, sore throat, back pain, fatigue, fever, nasal congestion.  He has a mild cough.  No nausea, vomiting, diarrhea, wheezing, shortness of breath, no change in taste or smell.  Using DayQuil/NyQuil combination.  Using Tylenol and ibuprofen to help with headache.  There were 3 of the people sick at work this week that went home.  He thinks someone at work gave him the illness.  Otherwise was in normal state of health  No other aggravating or relieving factors. No other complaint.   Review of Systems As in subjective    Objective:   Physical Exam Due to  coronavirus pandemic stay at home measures, patient visit was virtual and they were not examined in person.   Temp 98.1 F (36.7 C)   General: Well-developed well-nourished no distress, somewhat ill-appearing Occasional cough No wheezing or labored breathing    Assessment:     Encounter Diagnoses  Name Primary?   COVID-19 virus infection Yes   Acute cough    Intractable headache, unspecified chronicity pattern, unspecified headache type        Plan:      General recommendations: I recommend you rest, hydrate well with water and clear fluids throughout the day.   You can use Tylenol for pain or fever You can use over the counter Delsym for cough, or you can continue the NyQuil DayQuil combination.  Avoid doubling up on other similar medications given history of high blood pressure.. You can use over the counter Emetrol for nausea.   Begin over-the-counter emergenC immune plus vitamin pack  If you are having trouble breathing, if you are very weak, have high fever 103 or higher consistently despite Tylenol, or uncontrollable nausea and vomiting, then call or go to the emergency department.    If you have other questions or have other symptoms or questions you are concerned about then please make a virtual visit  Covid symptoms such as fatigue and cough can linger over 2 weeks, even after the initial fever, aches, chills, and other initial symptoms.   Self Quarantine: The CDC, Centers for Disease Control has recommended a self quarantine of 5 days from the start  of your illness until you are symptom-free including at least 24 hours of no symptoms including no fever, no shortness of breath, and no body aches and chills, by day 5 before returning to work or general contact with the public.  What does self quarantine mean: avoiding contact with people as much as possible.   Particularly in your house, isolate your self from others in a separate room, wear a mask when possible in the  room, particularly if coughing a lot.   Have others bring food, water, medications, etc., to your door, but avoid direct contact with your household contacts during this time to avoid spreading the infection to them.   If you have a separate bathroom and living quarters during the next 2 weeks away from others, that would be preferable.    If you can't completely isolate, then wear a mask, wash hands frequently with soap and water for at least 15 seconds, minimize close contact with others, and have a friend or family member check regularly from a distance to make sure you are not getting seriously worse.     You should not be going out in public, should not be going to stores, to work or other public places until all your symptoms have resolved and at least 5 days + 24 hours of no symptoms at all have transpired.   Ideally you should avoid contact with others for a full 5 days if possible.  One of the goals is to limit spread to high risk people; people that are older and elderly, people with multiple health issues like diabetes, heart disease, lung disease, and anybody that has weakened immune systems such as people with cancer or on immunosuppressive therapy.       Savage was seen today for covid positive.  Diagnoses and all orders for this visit:  COVID-19 virus infection  Acute cough  Intractable headache, unspecified chronicity pattern, unspecified headache type  Other orders -     Multiple Vitamins-Minerals (EMERGEN-C IMMUNE PLUS) PACK; Take 1 tablet by mouth 2 (two) times daily.  F/u prn

## 2022-04-15 ENCOUNTER — Encounter: Payer: Self-pay | Admitting: Internal Medicine

## 2022-06-02 ENCOUNTER — Encounter: Payer: Self-pay | Admitting: Medical

## 2022-06-02 ENCOUNTER — Telehealth: Payer: BC Managed Care – PPO | Admitting: Medical

## 2022-06-02 ENCOUNTER — Other Ambulatory Visit: Payer: BC Managed Care – PPO

## 2022-06-02 DIAGNOSIS — R0602 Shortness of breath: Secondary | ICD-10-CM | POA: Diagnosis not present

## 2022-06-02 DIAGNOSIS — R051 Acute cough: Secondary | ICD-10-CM

## 2022-06-02 DIAGNOSIS — J988 Other specified respiratory disorders: Secondary | ICD-10-CM | POA: Diagnosis not present

## 2022-06-02 DIAGNOSIS — J4520 Mild intermittent asthma, uncomplicated: Secondary | ICD-10-CM

## 2022-06-02 DIAGNOSIS — Z8709 Personal history of other diseases of the respiratory system: Secondary | ICD-10-CM

## 2022-06-02 MED ORDER — ALBUTEROL SULFATE HFA 108 (90 BASE) MCG/ACT IN AERS: 2.0000 | INHALATION_SPRAY | Freq: Four times a day (QID) | RESPIRATORY_TRACT | 1 refills | Status: AC | PRN

## 2022-06-02 MED ORDER — AZITHROMYCIN 250 MG PO TABS: ORAL_TABLET | ORAL | 0 refills | Status: AC

## 2022-06-02 MED ORDER — HYDROCOD POLI-CHLORPHE POLI ER 10-8 MG/5ML PO SUER: 5.0000 mL | Freq: Two times a day (BID) | ORAL | 0 refills | Status: AC

## 2022-06-02 NOTE — Progress Notes (Signed)
Subjective:     Patient ID: Kenneth Graham, male   DOB: May 17, 1986, 36 y.o.   MRN: [id number]  This visit type was conducted due to national recommendations for restrictions regarding the COVID-19 Pandemic (e.g. social distancing) in an effort to limit this patient's exposure and mitigate transmission in our community.  Due to their co-morbid illnesses, this patient is at least at moderate risk for complications without adequate follow up.  This format is felt to be most appropriate for this patient at this time.    Documentation for virtual audio and video telecommunications through [facility] encounter:  The patient was located at home. The provider was located in the office. The patient did consent to this visit and is aware of possible charges through their insurance for this visit.  The other persons participating in this telemedicine service were none. Time spent on call was 20 minutes and in review of previous records 20 minutes total.  This virtual service is not related to other E/M service within previous 7 days.    HPI Chief Complaint  Patient presents with   bad cough    Bad cough, since last Tuesday. Constant cough, trying mucinex and cough syrup. Had pneumonia back in 2011, stuffy nose. No covid testing.    Virtual for cough.  Had symptoms about 6 days.    Has ongoing cough, had stuffy nose, but cough lingering.  Feels like pneumonia he had in 2011.   Has had some sore throat but improved.  Getting up yellow mucous.  Feels a little SOB.  No body aches, no chills, no fever, no NVD.  His kids have had some colds, but no + flu or covid.   Using dayquil and Nyquil.  Doesn't currently have an inhaler.   No other aggravating or relieving factors.   No other complaint.  Past Medical History:  Diagnosis Date   Allergy    Anxiety    Asthma    childhood   Bipolar disorder (HCC)    Cocaine abuse (HCC)    in past   Depression    GERD (gastroesophageal reflux disease)     Hypertension    Palpitation    Psychiatric diagnosis    Dr. Karmen Graham, hospitalization [facility] in the past   Seasonal allergies    Thrombocytopenia (HCC)    remote past?   Wears glasses    Current Outpatient Medications on File Prior to Visit  Medication Sig Dispense Refill   acetaminophen (TYLENOL) 500 MG tablet Take 1,000 mg by mouth every 6 (six) hours as needed.     fluticasone (FLONASE) 50 MCG/ACT nasal spray Place 2 sprays into both nostrils daily. Reported on 01/14/2016     Multiple Vitamins-Minerals (EMERGEN-C IMMUNE PLUS) PACK Take 1 tablet by mouth 2 (two) times daily. 10 each 0   No current facility-administered medications on file prior to visit.     Review of Systems As in subjective    Objective:   Physical Exam Due to coronavirus pandemic stay at home measures, patient visit was virtual and they were not examined in person.   There were no vitals taken for this visit.  Gen: wd, wn, nad No witnessed wheezing or labored breathing Relatively well appearing      Assessment:     Encounter Diagnoses  Name Primary?   Acute cough Yes   Respiratory tract infection    Mild intermittent asthma, unspecified whether complicated    Shortness of breath    History  of asthma        Plan:     We discussed symptoms and concerns, limitations of virtual consult.  We discussed possibly getting an x-ray if not improving.  For now begin the medications below.  Do not overlap Tussionex and NyQuil.  Use DayQuil or Mucinex in the daytime and  Tussionex at night, rest, hydrate well, begin antibiotic below, begin back on albuterol the next several days.  If not much improved or if worse in the next few days then come in person for evaluation   [name] was seen today for bad cough.  Diagnoses and all orders for this visit:  Acute cough  Respiratory tract infection  Mild intermittent asthma, unspecified whether complicated  Shortness of breath -      albuterol (VENTOLIN HFA) 108 (90 Base) MCG/ACT inhaler; Inhale 2 puffs into the lungs every 6 (six) hours as needed for wheezing or shortness of breath.  History of asthma -     albuterol (VENTOLIN HFA) 108 (90 Base) MCG/ACT inhaler; Inhale 2 puffs into the lungs every 6 (six) hours as needed for wheezing or shortness of breath.  Other orders -     azithromycin (ZITHROMAX) 250 MG tablet; 2 tablets day 1, then 1 tablet days 2-4 -     chlorpheniramine-HYDROcodone (TUSSIONEX) 10-8 MG/5ML; Take 5 mLs by mouth 2 (two) times daily.    F/u prn

## 2022-12-21 DIAGNOSIS — S0101XA Laceration without foreign body of scalp, initial encounter: Secondary | ICD-10-CM | POA: Diagnosis not present

## 2022-12-21 DIAGNOSIS — Z556 Problems related to health literacy: Secondary | ICD-10-CM | POA: Diagnosis not present

## 2022-12-21 DIAGNOSIS — Z753 Unavailability and inaccessibility of health-care facilities: Secondary | ICD-10-CM | POA: Diagnosis not present

## 2023-10-21 ENCOUNTER — Ambulatory Visit: Admitting: Family Medicine

## 2023-10-21 DIAGNOSIS — F32A Depression, unspecified: Secondary | ICD-10-CM | POA: Diagnosis not present

## 2023-10-21 DIAGNOSIS — J309 Allergic rhinitis, unspecified: Secondary | ICD-10-CM | POA: Diagnosis not present

## 2023-10-21 DIAGNOSIS — Z0000 Encounter for general adult medical examination without abnormal findings: Secondary | ICD-10-CM | POA: Diagnosis not present

## 2023-10-21 DIAGNOSIS — K219 Gastro-esophageal reflux disease without esophagitis: Secondary | ICD-10-CM | POA: Diagnosis not present

## 2023-10-21 DIAGNOSIS — Z840 Family history of diseases of the skin and subcutaneous tissue: Secondary | ICD-10-CM | POA: Diagnosis not present
